# Patient Record
Sex: Male | Born: 1970 | Race: White | Hispanic: No | Marital: Married | Smoking: Former smoker
Health system: Southern US, Community
[De-identification: ages and names within clinical notes are randomized; demographics above are authoritative.]

## PROBLEM LIST (undated history)

## (undated) DIAGNOSIS — E785 Hyperlipidemia, unspecified: Secondary | ICD-10-CM

## (undated) HISTORY — DX: Hyperlipidemia, unspecified: E78.5

## (undated) HISTORY — PX: TONSILLECTOMY: SUR1361

---

## 2003-06-09 ENCOUNTER — Ambulatory Visit (HOSPITAL_COMMUNITY): Admission: RE | Admit: 2003-06-09 | Discharge: 2003-06-09 | Payer: Self-pay | Admitting: Otolaryngology

## 2015-12-31 ENCOUNTER — Other Ambulatory Visit: Payer: Self-pay | Admitting: Family Medicine

## 2016-01-01 LAB — CMP12+LP+TP+TSH+6AC+PSA+CBC…
A/G RATIO: 1.8 (ref 1.2–2.2)
ALT: 26 IU/L (ref 0–44)
AST: 19 IU/L (ref 0–40)
Albumin: 4.6 g/dL (ref 3.5–5.5)
Alkaline Phosphatase: 84 IU/L (ref 39–117)
BASOS: 0 %
BILIRUBIN TOTAL: 0.6 mg/dL (ref 0.0–1.2)
BUN / CREAT RATIO: 14 (ref 9–20)
BUN: 14 mg/dL (ref 6–24)
Basophils Absolute: 0 10*3/uL (ref 0.0–0.2)
CHLORIDE: 102 mmol/L (ref 96–106)
CHOL/HDL RATIO: 3.7 ratio (ref 0.0–5.0)
CREATININE: 0.98 mg/dL (ref 0.76–1.27)
Calcium: 9.8 mg/dL (ref 8.7–10.2)
Cholesterol, Total: 201 mg/dL — ABNORMAL HIGH (ref 100–199)
EOS (ABSOLUTE): 0.2 10*3/uL (ref 0.0–0.4)
EOS: 4 %
Estimated CHD Risk: 0.6 times avg. (ref 0.0–1.0)
Free Thyroxine Index: 1.7 (ref 1.2–4.9)
GFR, EST AFRICAN AMERICAN: 107 mL/min/{1.73_m2} (ref >59)
GFR, EST NON AFRICAN AMERICAN: 93 mL/min/{1.73_m2} (ref >59)
GGT: 12 IU/L (ref 0–65)
GLUCOSE: 99 mg/dL (ref 65–99)
Globulin, Total: 2.5 g/dL (ref 1.5–4.5)
HDL: 54 mg/dL (ref >39)
HEMATOCRIT: 46.6 % (ref 37.5–51.0)
HEMOGLOBIN: 15.4 g/dL (ref 12.6–17.7)
IMMATURE GRANS (ABS): 0 10*3/uL (ref 0.0–0.1)
Immature Granulocytes: 1 %
Iron: 75 ug/dL (ref 38–169)
LDH: 194 IU/L (ref 121–224)
LDL Calculated: 123 mg/dL — ABNORMAL HIGH (ref 0–99)
LYMPHS: 38 %
Lymphocytes Absolute: 1.6 10*3/uL (ref 0.7–3.1)
MCH: 29.7 pg (ref 26.6–33.0)
MCHC: 33 g/dL (ref 31.5–35.7)
MCV: 90 fL (ref 79–97)
MONOCYTES: 9 %
Monocytes Absolute: 0.4 10*3/uL (ref 0.1–0.9)
Neutrophils Absolute: 2 10*3/uL (ref 1.4–7.0)
Neutrophils: 48 %
PHOSPHORUS: 2.4 mg/dL — AB (ref 2.5–4.5)
POTASSIUM: 4.4 mmol/L (ref 3.5–5.2)
Platelets: 278 10*3/uL (ref 150–379)
Prostate Specific Ag, Serum: 0.5 ng/mL (ref 0.0–4.0)
RBC: 5.18 x10E6/uL (ref 4.14–5.80)
RDW: 13.7 % (ref 12.3–15.4)
Sodium: 144 mmol/L (ref 134–144)
T3 Uptake Ratio: 28 % (ref 24–39)
T4, Total: 5.9 ug/dL (ref 4.5–12.0)
TSH: 3.53 u[IU]/mL (ref 0.450–4.500)
Total Protein: 7.1 g/dL (ref 6.0–8.5)
Triglycerides: 121 mg/dL (ref 0–149)
URIC ACID: 7.3 mg/dL (ref 3.7–8.6)
VLDL CHOLESTEROL CAL: 24 mg/dL (ref 5–40)
WBC: 4.2 10*3/uL (ref 3.4–10.8)

## 2016-01-01 LAB — HGB A1C W/O EAG: Hgb A1c MFr Bld: 5.5 % (ref 4.8–5.6)

## 2016-08-10 ENCOUNTER — Ambulatory Visit: Payer: Self-pay | Admitting: Registered Nurse

## 2016-08-10 VITALS — BP 140/88 | HR 52 | Temp 98.0°F

## 2016-08-10 DIAGNOSIS — H65193 Other acute nonsuppurative otitis media, bilateral: Secondary | ICD-10-CM

## 2016-08-10 DIAGNOSIS — J029 Acute pharyngitis, unspecified: Secondary | ICD-10-CM

## 2016-08-10 DIAGNOSIS — J00 Acute nasopharyngitis [common cold]: Secondary | ICD-10-CM

## 2016-08-10 MED ORDER — IBUPROFEN 800 MG PO TABS: 800.0000 mg | ORAL_TABLET | Freq: Three times a day (TID) | ORAL | 0 refills | Status: DC

## 2016-08-10 MED ORDER — AMOXICILLIN 875 MG PO TABS: 875.0000 mg | ORAL_TABLET | Freq: Two times a day (BID) | ORAL | 0 refills | Status: DC

## 2016-08-10 MED ORDER — SALINE SPRAY 0.65 % NA SOLN: 2.0000 | NASAL | 0 refills | Status: DC

## 2016-08-10 MED ORDER — FLUTICASONE PROPIONATE 50 MCG/ACT NA SUSP
1.0000 | Freq: Two times a day (BID) | NASAL | 0 refills | Status: DC
Start: 2016-08-10 — End: 2016-12-29

## 2016-08-10 NOTE — Patient Instructions (Addendum)
Honey with lemon, hydrate, cool/ice in drinks Motrin  by mouth every 8 hours as needed for pain Start flonase 1 spray each nostril twice a day Nasal saline 2 sprays each nostril every 2 hours while awake Shower twice a day Amoxicillin  by mouth twice a day for 10 days Avoid sticking q-tips/fingers in ear canals  Allergic Rhinitis Allergic rhinitis is when the mucous membranes in the nose respond to allergens. Allergens are particles in the air that cause your body to have an allergic reaction. This causes you to release allergic antibodies. Through a chain of events, these eventually cause you to release histamine into the blood stream. Although meant to protect the body, it is this release of histamine that causes your discomfort, such as frequent sneezing, congestion, and an itchy, runny nose. What are the causes? Seasonal allergic rhinitis (hay fever) is caused by pollen allergens that may come from grasses, trees, and weeds. Year-round allergic rhinitis (perennial allergic rhinitis) is caused by allergens such as house dust mites, pet dander, and mold spores. What are the signs or symptoms?  Nasal stuffiness (congestion).  Itchy, runny nose with sneezing and tearing of the eyes. How is this diagnosed? Your health care provider can help you determine the allergen or allergens that trigger your symptoms. If you and your health care provider are unable to determine the allergen, skin or blood testing may be used. Your health care provider will diagnose your condition after taking your health history and performing a physical exam. Your health care provider may assess you for other related conditions, such as asthma, pink eye, or an ear infection. How is this treated? Allergic rhinitis does not have a cure, but it can be controlled by:  Medicines that block allergy symptoms. These may include allergy shots, nasal sprays, and oral antihistamines.  Avoiding the allergen. Hay fever  may often be treated with antihistamines in pill or nasal spray forms. Antihistamines block the effects of histamine. There are over-the-counter medicines that may help with nasal congestion and swelling around the eyes. Check with your health care provider before taking or giving this medicine. If avoiding the allergen or the medicine prescribed do not work, there are many new medicines your health care provider can prescribe. Stronger medicine may be used if initial measures are ineffective. Desensitizing injections can be used if medicine and avoidance does not work. Desensitization is when a patient is given ongoing shots until the body becomes less sensitive to the allergen. Make sure you follow up with your health care provider if problems continue. Follow these instructions at home: It is not possible to completely avoid allergens, but you can reduce your symptoms by taking steps to limit your exposure to them. It helps to know exactly what you are allergic to so that you can avoid your specific triggers. Contact a health care provider if:  You have a fever.  You develop a cough that does not stop easily (persistent).  You have shortness of breath.  You start wheezing.  Symptoms interfere with normal daily activities. This information is not intended to replace advice given to you by your health care provider. Make sure you discuss any questions you have with your health care provider. Document Released: 01/17/2001 Document Revised: 12/24/2015 Document Reviewed: 12/30/2012 Elsevier Interactive Patient Education  2017 [employer] Nonallergic Rhinitis  1. Nonallergic rhinitis is a term used by allergist to describe inflammation in the nose that is not due to an allergic source (i.e., pollens, mold, animal  dander or dust mites). 2. Nonallergic rhinitis can mimic many of the symptoms caused by allergies.  This includes a runny nose ("rhinorrhea"), sneezing, congestion, ear fullness and  post nasal drip. 3. These symptoms usually occur year round but can be made worse by many environmental factors including weather change, irritants such as cigarette smoke, fumes, perfumes, detergents and many others. These are not allergens, but are irritants, and do not cause the formation of antibodies like true allergens.  For this reason most people with nonallergic rhinitis have negative skin tests. 4. Unfortunately, we have a poor understanding of what causes nonallergic rhinitis but certain factors such as deviated septum, sinusitis and nasal polyps may contribute to the symptoms.  Due to the poor understanding of what causes nonallergic rhinitis it cannot be cured and our treatment is only symptomatic to control symptoms. 5. Important factors in the successful treatment of nonallergic rhinitis include avoidance of the irritants that cause symptoms; for example, people who continue to smoke may never improve.  Continuous therapy works better than intermittent.  This may mean taking medications once daily or 3-4 times a day. Helpful treatments include nasal saline lavage, nasal ipratropium (Atrovent), nasal steroids, and decongestants.  Pure antihistamines don't seem to work very well.  Immunotherapy (allergy shots) has no role in the treatment of nonallergic rhinitis.  Several medications may have to be tried before a good combination is found for you.  These medications, in general, are safe for long term use.  Tolerance may develop to the therapeutic effects of these medications: Frequently changing between several helpful medications can prevent this should it occur.Pharyngitis Pharyngitis is redness, pain, and swelling (inflammation) of your pharynx. What are the causes? Pharyngitis is usually caused by infection. Most of the time, these infections are from viruses (viral) and are part of a cold. However, sometimes pharyngitis is caused by bacteria (bacterial). Pharyngitis can also be caused by  allergies. Viral pharyngitis may be spread from person to person by coughing, sneezing, and personal items or utensils (cups, forks, spoons, toothbrushes). Bacterial pharyngitis may be spread from person to person by more intimate contact, such as kissing. What are the signs or symptoms? Symptoms of pharyngitis include:  Sore throat.  Tiredness (fatigue).  Low-grade fever.  Headache.  Joint pain and muscle aches.  Skin rashes.  Swollen lymph nodes.  Plaque-like film on throat or tonsils (often seen with bacterial pharyngitis). How is this diagnosed? Your health care provider will ask you questions about your illness and your symptoms. Your medical history, along with a physical exam, is often all that is needed to diagnose pharyngitis. Sometimes, a rapid strep test is done. Other lab tests may also be done, depending on the suspected cause. How is this treated? Viral pharyngitis will usually get better in 3-4 days without the use of medicine. Bacterial pharyngitis is treated with medicines that kill germs (antibiotics). Follow these instructions at home:  Drink enough water and fluids to keep your urine clear or pale yellow.  Only take over-the-counter or prescription medicines as directed by your health care provider:  If you are prescribed antibiotics, make sure you finish them even if you start to feel better.  Do not take aspirin.  Get lots of rest.  Gargle with 8 oz of salt water ( tsp of salt per 1 qt of water) as often as every 1-2 hours to soothe your throat.  Throat lozenges (if you are not at risk for choking) or sprays may be used to  soothe your throat. Contact a health care provider if:  You have large, tender lumps in your neck.  You have a rash.  You cough up green, yellow-brown, or bloody spit. Get help right away if:  Your neck becomes stiff.  You drool or are unable to swallow liquids.  You vomit or are unable to keep medicines or liquids  down.  You have severe pain that does not go away with the use of recommended medicines.  You have trouble breathing (not caused by a stuffy nose). This information is not intended to replace advice given to you by your health care provider. Make sure you discuss any questions you have with your health care provider. Document Released: 04/24/2005 Document Revised: 09/30/2015 Document Reviewed: 12/30/2012 Elsevier Interactive Patient Education  2017 [employer]  Otitis Media, Adult Otitis media is redness, soreness, and puffiness (swelling) in the space just behind your eardrum (middle ear). It may be caused by allergies or infection. It often happens along with a cold. Follow these instructions at home:  Take your medicine as told. Finish it even if you start to feel better.  Only take over-the-counter or prescription medicines for pain, discomfort, or fever as told by your doctor.  Follow up with your doctor as told. Contact a doctor if:  You have otitis media only in one ear, or bleeding from your nose, or both.  You notice a lump on your neck.  You are not getting better in 3-5 days.  You feel worse instead of better. Get help right away if:  You have pain that is not helped with medicine.  You have puffiness, redness, or pain around your ear.  You get a stiff neck.  You cannot move part of your face (paralysis).  You notice that the bone behind your ear hurts when you touch it. This information is not intended to replace advice given to you by your health care provider. Make sure you discuss any questions you have with your health care provider. Document Released: 10/11/2007 Document Revised: 09/30/2015 Document Reviewed: 11/19/2012 Elsevier Interactive Patient Education  2017 [employer].

## 2016-08-10 NOTE — Progress Notes (Signed)
Subjective:    Patient ID: John [name], male    DOB: April 11, 1971, 46 y.o.   MRN: [id number]  45y/o caucasian male Pt reports feeling of lump in throat with difficulty swallowing x2 days. Denies nasal congestion, cough, chest congestion. RN performed otoscope exam this am, and pt has been having soreness in both ears since then. Difficult to visualize due to pt's sensitivity, but no erythema or drainage noted. Scarring present on L TM at 1oclock position. Hx of ear infections when younger. Took Benadryl 2 hours PTA for possible allergies.  Uses q-tips in ears once a month.  Denied recent illness.  Sometimes gets spring allergies denied eye itching/watering.  Exposure to sick coworkers in past week.      Review of Systems  Constitutional: Negative for activity change, appetite change, chills, diaphoresis, fatigue, fever and unexpected weight change.  HENT: Positive for congestion, ear pain, rhinorrhea, sore throat and trouble swallowing. Negative for dental problem, drooling, ear discharge, facial swelling, hearing loss, mouth sores, nosebleeds, postnasal drip, sinus pain, sinus pressure, sneezing, tinnitus and voice change.   Eyes: Negative for photophobia, pain, discharge, redness, itching and visual disturbance.  Respiratory: Negative for cough, choking, chest tightness, shortness of breath, wheezing and stridor.   Cardiovascular: Negative for chest pain, palpitations and leg swelling.  Gastrointestinal: Negative for abdominal distention, abdominal pain, blood in stool, constipation, diarrhea, nausea and vomiting.  Endocrine: Negative for cold intolerance and heat intolerance.  Genitourinary: Negative for dysuria.  Musculoskeletal: Negative for arthralgias, back pain, gait problem, joint swelling, myalgias, neck pain and neck stiffness.  Skin: Negative for color change, pallor, rash and wound.  Allergic/Immunologic: Positive for environmental allergies. Negative for food allergies and  immunocompromised state.  Neurological: Negative for dizziness, tremors, seizures, syncope, facial asymmetry, speech difficulty, weakness, light-headedness, numbness and headaches.  Hematological: Negative for adenopathy. Does not bruise/bleed easily.  Psychiatric/Behavioral: Negative for agitation, behavioral problems, confusion and sleep disturbance.       Objective:   Physical Exam  Constitutional: He is oriented to person, place, and time. Vital signs are normal. He appears well-developed and well-nourished. He is active and cooperative.  Non-toxic appearance. He does not have a sickly appearance. He does not appear ill. No distress.  HENT:  Head: Normocephalic and atraumatic.  Right Ear: Hearing and ear canal normal. There is swelling and tenderness. Tympanic membrane is erythematous and bulging. A middle ear effusion is present.  Left Ear: Hearing and ear canal normal. There is swelling and tenderness. Tympanic membrane is erythematous and bulging. A middle ear effusion is present.  Nose: Mucosal edema and rhinorrhea present. No nose lacerations, sinus tenderness, nasal deformity, septal deviation or nasal septal hematoma. No epistaxis.  No foreign bodies. Right sinus exhibits maxillary sinus tenderness. Right sinus exhibits no frontal sinus tenderness. Left sinus exhibits maxillary sinus tenderness. Left sinus exhibits no frontal sinus tenderness.  Mouth/Throat: Uvula is midline and mucous membranes are normal. Mucous membranes are not pale, not dry and not cyanotic. He does not have dentures. No oral lesions. No trismus in the jaw. Normal dentition. No dental abscesses, uvula swelling, lacerations or dental caries. Posterior oropharyngeal edema and posterior oropharyngeal erythema present. No oropharyngeal exudate or tonsillar abscesses.  Bilateral external auditory canals with dry flaky skin and abrasion noted 6 oclock bilateral distal canals; bilateral TMs air fluid level slight opacity  erythema bulging air fluid level; auditory canal with edema 1-2+ distal near abrasion and debris noted yellow 6 oclock; cobblestoning posterior pharynx; bilateral allergic shiners;  bilateral nasal turbinates edema/erythema clear discharge  Eyes: Conjunctivae, EOM and lids are normal. Pupils are equal, round, and reactive to light. Right eye exhibits no chemosis, no discharge, no exudate and no hordeolum. No foreign body present in the right eye. Left eye exhibits no chemosis, no discharge, no exudate and no hordeolum. No foreign body present in the left eye. Right conjunctiva is not injected. Right conjunctiva has no hemorrhage. Left conjunctiva is not injected. Left conjunctiva has no hemorrhage. No scleral icterus. Right eye exhibits normal extraocular motion and no nystagmus. Left eye exhibits normal extraocular motion and no nystagmus. Right pupil is round and reactive. Left pupil is round and reactive. Pupils are equal.  Neck: Trachea normal and normal range of motion. Neck supple. No tracheal tenderness, no spinous process tenderness and no muscular tenderness present. No neck rigidity. No tracheal deviation, no edema, no erythema and normal range of motion present. No thyroid mass and no thyromegaly present.  Cardiovascular: Normal rate, regular rhythm, S1 normal, S2 normal, normal heart sounds and intact distal pulses.  PMI is not displaced.  Exam reveals no gallop and no friction rub.   No murmur heard. Pulmonary/Chest: Effort normal and breath sounds normal. No stridor. No respiratory distress. He has no decreased breath sounds. He has no wheezes. He has no rhonchi. He has no rales.  Speaks full sentences without difficulty; no cough observed  Abdominal: Soft. He exhibits no distension.  Musculoskeletal: Normal range of motion. He exhibits no edema or tenderness.       Right shoulder: Normal.       Left shoulder: Normal.       Right elbow: Normal.      Left elbow: Normal.       Right hip:  Normal.       Left hip: Normal.       Right knee: Normal.       Left knee: Normal.       Cervical back: Normal.       Thoracic back: Normal.       Lumbar back: Normal.       Right hand: Normal.       Left hand: Normal.  Lymphadenopathy:       Head (right side): No submental, no submandibular, no tonsillar, no preauricular, no posterior auricular and no occipital adenopathy present.       Head (left side): No submental, no submandibular, no tonsillar, no preauricular, no posterior auricular and no occipital adenopathy present.    He has no cervical adenopathy.       Right cervical: No superficial cervical, no deep cervical and no posterior cervical adenopathy present.      Left cervical: No superficial cervical, no deep cervical and no posterior cervical adenopathy present.  Neurological: He is alert and oriented to person, place, and time. He is not disoriented. He displays no atrophy and no tremor. No cranial nerve deficit or sensory deficit. He exhibits normal muscle tone. He displays no seizure activity. Coordination and gait normal. GCS eye subscore is 4. GCS verbal subscore is 5. GCS motor subscore is 6.  Skin: Skin is warm, dry and intact. No abrasion, no bruising, no burn, no ecchymosis, no laceration, no lesion, no petechiae and no rash noted. He is not diaphoretic. No cyanosis or erythema. No pallor. Nails show no clubbing.  Psychiatric: He has a normal mood and affect. His speech is normal and behavior is normal. Judgment and thought content normal. Cognition and memory are normal.  Nursing note and vitals reviewed.         Assessment & Plan:  A-bilateral otitis media acute nonreoccurent nonsupportive; acute pharyngitis; acute rhinitis  P-Amoxicillin  po BID x 10 days #20 RF0 dispensed from [facility].  Patient has motrin at home discussed  po TID prn pain.  Avoid use of q-tips in ears.Treatment as ordered.  Symptomatic therapy suggested fluids, NSAIDs and rest.  May take  Tylenol or Motrin for fevers.  Call or return to clinic as needed if these symptoms worsen or fail to improve as anticipated. Exitcare handout on otitis media given to patient.  Patient verbalized agreement and understanding of treatment plan.   P2:  Hand washing  Usually no specific medical treatment is needed if a virus is causing the sore throat.  The throat most often gets better on its own within 5 to 7 days.  Antibiotic medicine does not cure viral pharyngitis.   For acute pharyngitis caused by bacteria, your healthcare provider will prescribe an antibiotic.  Marland Kitchen Do not smoke.  Marland Kitchen Avoid secondhand smoke and other air pollutants.  . Use a cool mist humidifier to add moisture to the air.  . Get plenty of rest.  . You may want to rest your throat by talking less and eating a diet that is mostly liquid or soft for a day or two.   Marland Kitchen Nonprescription throat lozenges and mouthwashes should help relieve the soreness.   . Gargling with warm saltwater and drinking warm liquids may help.  (You can make a saltwater solution by adding 1/4 teaspoon of salt to 8 ounces, or 240 mL, of warm water.)  . A nonprescription pain reliever such as aspirin, acetaminophen, or ibuprofen may ease general aches and pains.   FOLLOW UP with clinic provider if no improvements in the next 7-10 days.  Patient verbalized understanding of instructions and agreed with plan of care. P2:  Hand washing and diet.  Discussed with patient this could be viral or allergic rhinitis as increased pollen and viral activity in community the past two weeks.  Flonase works against viral, allergic and bacterial rhinitis.  Patient denied self/ family history ENT cancer.  Patient may use normal saline nasal spray as needed.  Consider antihistamine (zyrtec/claritin  po daily or benadryl  po TID prn rhinitis).  Start flonase 1 spray each nostril BID #1 RF0 dispensed from [facility] and nasal saline 1 UD bottle use 2 sprays each nostril q2h wa dispensed  from clinic stock.  Avoid triggers if possible.  Shower prior to bedtime if exposed to triggers.  If allergic dust/dust mites recommend mattress/pillow covers/encasements; washing linens, vacuuming, sweeping, dusting weekly.  Call or return to clinic as needed if these symptoms worsen or fail to improve as anticipated.   Exitcare handout on allergic rhinitis given to patient.  Patient verbalized understanding of instructions, agreed with plan of care and had no further questions at this time.  P2:  Avoidance and hand washing.

## 2016-10-03 ENCOUNTER — Ambulatory Visit: Payer: Self-pay | Admitting: Registered Nurse

## 2016-10-03 VITALS — BP 140/88 | HR 44 | Temp 97.4°F

## 2016-10-03 DIAGNOSIS — W57XXXA Bitten or stung by nonvenomous insect and other nonvenomous arthropods, initial encounter: Secondary | ICD-10-CM

## 2016-10-03 MED ORDER — DOXYCYCLINE HYCLATE 100 MG PO TABS: 100.0000 mg | ORAL_TABLET | Freq: Two times a day (BID) | ORAL | 0 refills | Status: AC

## 2016-10-03 NOTE — Patient Instructions (Signed)
Doxycycline 100mg  by mouth twice a day for 10 days May apply hydrocortisone 1% twice a day for 7 days to affected areas Zyrtec 10mg  by mouth once a day prn itching Use DEET prior to hiking in woods on skin; consider treating clothes with permethin Follow up re-evaluation if purulent discharge; worsening rash, fever, joint aches, headache, fatigue, muscle aches  Tick Bite Information, Adult Ticks are insects that draw blood for food. Most ticks live in shrubs and grassy areas. They climb onto people and animals that brush against the leaves and grasses that they rest on. Then they bite, attaching themselves to the skin. Most ticks are harmless, but some ticks carry germs that can spread to a person through a bite and cause a disease. To reduce your risk of getting a disease from a tick bite, it is important to take steps to prevent tick bites. It is also important to check for ticks after being outdoors. If you find that a tick has attached to you, watch for symptoms of disease. How can I prevent tick bites? Take these steps to help prevent tick bites when you are outdoors in an area where ticks are found:  Use insect repellent that has DEET (20% or higher), picaridin, or IR3535 in it. Use it on:  Skin that is showing.  The top of your boots.  Your pant legs.  Your sleeve cuffs.  For repellent products that contain permethrin, follow product instructions. Use these products on:  Clothing.  Gear.  Boots.  Tents.  Wear protective clothing. Long sleeves and long pants offer the best protection from ticks.  Wear light-colored clothing so you can see ticks more easily.  Tuck your pant legs into your socks.  If you go walking on a trail, stay in the middle of the trail so your skin, hair, and clothing do not touch the bushes.  Avoid walking through areas with long grass.  Check for ticks on your clothing, hair, and skin often while you are outside, and check again before you go  inside. Make sure to check the places that ticks attach themselves most often. These places include the scalp, neck, armpits, waist, groin, and joint areas. Ticks that carry a disease called Lyme disease have to be attached to the skin for 24-48 hours. Checking for ticks every day will lessen your risk of this and other diseases.  When you come indoors, wash your clothes and take a shower or a bath right away. Dry your clothes in a dryer on high heat for at least 60 minutes. This will kill any ticks in your clothes. What is the proper way to remove a tick? If you find a tick on your body, remove it as soon as possible. Removing a tick sooner rather than later can prevent germs from passing from the tick to your body. To remove a tick that is crawling on your skin but has not bitten:  Go outdoors and brush the tick off.  Remove the tick with tape or a lint roller. To remove a tick that is attached to your skin:  Wash your hands.  If you have latex gloves, put them on.  Use tweezers, curved forceps, or a tick-removal tool to gently grasp the tick as close to your skin and the tick's head as possible.  Gently pull with steady, upward pressure until the tick lets go. When removing the tick:  Take care to keep the tick's head attached to its body.  Do not twist  or jerk the tick. This can make the tick's head or mouth break off.  Do not squeeze or crush the tick's body. This could force disease-carrying fluids from the tick into your body. Do not try to remove a tick with heat, alcohol, petroleum jelly, or fingernail polish. Using these methods can cause the tick to salivate and regurgitate into your bloodstream, increasing your risk of getting a disease. What should I do after removing a tick?  Clean the bite area with soap and water, rubbing alcohol, or an iodine scrub.  If an antiseptic cream or ointment is available, apply a small amount to the bite site.  Wash and disinfect any  instruments that you used to remove the tick. How should I dispose of a tick? To dispose of a live tick, use one of these methods:  Place it in rubbing alcohol.  Place it in a sealed bag or container.  Wrap it tightly in tape.  Flush it down the toilet. Contact a health care provider if:  You have symptoms of a disease after a tick bite. Symptoms of a tick-borne disease can occur from moments after the tick bites to up to 30 days after a tick is removed. Symptoms include:  Muscle, joint, or bone pain.  Difficulty walking or moving your legs.  Numbness in the legs.  Paralysis.  Red rash around the tick bite area that is shaped like a target or a "bull's-eye."  Redness and swelling in the area of the tick bite.  Fever.  Repeated vomiting.  Diarrhea.  Weight loss.  Tender, swollen lymph glands.  Shortness of breath.  Cough.  Pain in the abdomen.  Headache.  Abnormal tiredness.  A change in your level of consciousness.  Confusion. Get help right away if:  You are not able to remove a tick.  A part of a tick breaks off and gets stuck in your skin.  Your symptoms get worse. Summary  Ticks may carry germs that can spread to a person through a bite and cause disease.  Wear protective clothing and use insect repellent to prevent tick bites. Follow product instructions.  If you find a tick on your body, remove it as soon as possible. If the tick is attached, do not try to remove with heat, alcohol, petroleum jelly, or fingernail polish.  Remove the attached tick using tweezers, curved forceps, or a tick-removal tool. Gently pull with steady, upward pressure until the tick lets go. Do not twist or jerk the tick. Do not squeeze or crush the tick's body.  If you have symptoms after being bitten by a tick, contact a health care provider. This information is not intended to replace advice given to you by your health care provider. Make sure you discuss any  questions you have with your health care provider. Document Released: 04/21/2000 Document Revised: 02/04/2016 Document Reviewed: 02/04/2016 Elsevier Interactive Patient Education  2017 [employer]  Preventing Mosquito-Borne Illnesses Mosquito-borne illnesses are diseases that are carried by mosquitoes. You may become sick if you are bitten by a mosquito that is carrying a virus or parasite. These germs can be different depending on where you are in the world. Examples of mosquito-borne illnesses include Zika virus disease, West Nile virus infection, and Dengue fever. You may be at risk for mosquito-borne illnesses both at home and when you travel. If you are planning to travel internationally:  Talk with a health care provider who is familiar with travel medicine 4-6 weeks before your trip.  Ask about the risks of mosquito-borne illness and how to stay safe.  Take malaria prevention medicine as told by your health care provider, if you will be traveling to an area where malaria is found. You may need to start taking this medicine before you go on your trip. How can I protect myself from mosquito bites? Take the following actions to protect yourself and your family from mosquito bites at home and when you travel:  Wear loose clothing that covers your arms and legs.  Limit your outdoor activities. Avoid being outdoors in the early evening. Mosquitoes are most active during that time.  Do not open windows unless they have window screens.  Sleep under mosquito nets.  Use insect repellent that contains at least 20% DEET. Other ingredients that help to protect against mosquitoes include picaridin, oil of lemon eucalyptus (OLE), and IR3535.  Most insect repellents are safe to use during pregnancy. Talk with your health care provider if you have questions or concerns.  Do not use OLE on children who are younger than 64 years of age. Do not use insect repellent on babies who are younger than 63  months of age.  Only apply insect repellent to surfaces that are directly exposed to air. Apply to any exposed skin or to the surface of your clothing. Do not apply insect repellent underneath clothing.  If you are using sunscreen, apply it before you apply insect repellent.  Treat clothing with permethrin. Do not apply permethrin directly to your skin. Follow label directions for safe use.  Get rid of any standing water near you because that is where mosquitoes often reproduce. Standing water is often found in items such as buckets, bowls, animal food dishes, and flowerpots. How should I use DEET insect repellent?  Use DEET according to the directions on the label.  When applying DEET to children, use the lowest effective concentration. Repellent with 10% DEET will last approximately 2-3 hours, while 30% DEET will last 4-5 hours. Do not use DEET on babies who are younger than 72 months of age. Do not apply DEET more often than one time a day to children who are younger than 59 years of age.  Avoid prolonged or excessive use of DEET. Use it sparingly to cover exposed skin and clothing. Skin irritation can occur in some people, but this is not common.  Wash all treated skin and clothing with soap and water after you go back indoors.  Do not allow children to apply insect repellent by themselves.  Do not apply DEET near cuts or open wounds.  Do not apply DEET to a child's hands or near a child's eyes and mouth.  If DEET is accidentally sprayed in the eyes, wash the eyes out with large amounts of water.  If your child swallows a DEET-containing product, rinse the mouth, have the child drink water, and call your health care provider.  Store DEET where children cannot reach it.  If you are pregnant, use DEET only when you are in areas with a high risk of mosquito-borne illness. What should I watch for if I have a mosquito bite? Most mosquito bites will not turn into a serious illness.  However, if you recently traveled to an area where mosquito-borne illnesses are common, make sure to watch for the following symptoms:  Sudden high fever.  Chills.  Severe headache.  Pain behind the eyes.  Bone pain.  Nausea and vomiting.  Muscle and joint pain.  Lack of appetite.  Feeling extremely tired or generally unwell. Call your health care provider if you experience these symptoms up to two weeks after being bit by a mosquito. Where can I get more information? Before you travel, visit the Travelers' Health site of the Centers for Disease Control and Prevention (CDC) to learn more about mosquito-borne illnesses:  http://www.church.org/ Summary  You may be at risk for mosquito-borne illnesses both at home and when you travel.  Talk with a health care provider who is trained in travel medicine 4-6 weeks before you travel internationally. Ask about the risks of mosquito-borne illness and how to stay safe.  Use a DEET-containing insect repellent to prevent mosquito bites. This information is not intended to replace advice given to you by your health care provider. Make sure you discuss any questions you have with your health care provider. Document Released: 01/17/2001 Document Revised: 09/30/2015 Document Reviewed: 11/26/2014 Elsevier Interactive Patient Education  2017 Elsevier Inc.  Lyme Disease Lyme disease is an infection that affects many parts of the body, including the skin, joints, and nervous system. It is a bacterial infection that starts from the bite of an infected tick. The infection can spread, and some of the symptoms are similar to the flu. If Lyme disease is not treated, it may cause joint pain, swelling, numbness, problems thinking, fatigue, muscle weakness, and other problems. What are the causes? This condition is caused by bacteria called Borrelia burgdorferi. You can get Lyme disease by being bitten by an infected tick. The tick must be attached to your  skin to pass along the infection. Deer often carry infected ticks. What increases the risk? The following factors may make you more likely to develop this condition:  Living in or visiting these areas in the [place]  [place].  The [place].  The [place].  Spending time in wooded or grassy areas.  Being outdoors with exposed skin.  Camping, gardening, hiking, fishing, or hunting outdoors.  Failing to remove a tick from your skin within 3-4 days. What are the signs or symptoms? Symptoms of this condition include:  A round, red rash that surrounds the center of the tick bite. This is the first sign of infection. The center of the rash may be blood colored or have tiny blisters.  Fatigue.  Headache.  Chills and fever.  General achiness.  Joint pain, often in the knees.  Muscle pain.  Swollen lymph glands.  Stiff neck. How is this diagnosed? This condition is diagnosed based on:  Your symptoms and medical history.  A physical exam.  A blood test. How is this treated? The main treatment for this condition is antibiotic medicine, which is usually taken by mouth (orally). The length of treatment depends on how soon after a tick bite you begin taking the medicine. In some cases, treatment is necessary for several weeks. If the infection is severe, antibiotics may need to be given through an IV tube that is inserted into one of your veins. Follow these instructions at home:  Take your antibiotic medicine as told by your health care provider. Do not stop taking the antibiotic even if you start to feel better.  Ask your health care provider about takinga probiotic in between doses of your antibiotic to help avoid stomach upset or diarrhea.  Check with your health care provider before supplementing your treatment. Many alternative therapies have not been proven and may be harmful to you.  Keep all follow-up visits as told by your health care provider.  This is important. How is this prevented? You can become reinfected if you get another tick bite from an infected tick. Take these steps to help prevent an infection:  Cover your skin with light-colored clothing when you are outdoors in the spring and summer months.  Spray clothing and skin with bug spray. The spray should be 20-30% DEET.  Avoid wooded, grassy, and shaded areas.  Remove yard litter, brush, trash, and plants that attract deer and rodents.  Check yourself for ticks when you come indoors.  Wash clothing worn each day.  Check your pets for ticks before they come inside.  If you find a tick:  Remove it with tweezers.  Clean your hands and the bite area with rubbing alcohol or soap and water. Pregnant women should take special care to avoid tick bites because the infection can be passed along to the fetus. Contact a health care provider if:  You have symptoms after treatment.  You have removed a tick and want to bring it to your health care provider for testing. Get help right away if:  You have an irregular heartbeat.  You have nerve pain.  Your face feels numb. This information is not intended to replace advice given to you by your health care provider. Make sure you discuss any questions you have with your health care provider. Document Released: 07/31/2000 Document Revised: 12/14/2015 Document Reviewed: 12/14/2015 Elsevier Interactive Patient Education  2017 [employer].

## 2016-10-03 NOTE — Progress Notes (Signed)
Subjective:    Patient ID: John [name], male    DOB: 1970-11-01, 46 y.o.   MRN: [id number]  45y/o married male caucasian established patient reports 3 tick bites on Saturday, 3 days ago, after working in the woods cutting down 6 trees. Removed all 3 including the heads within 2 hours after working. Then 2 additional bites yesterday while working outside. Also removed these. Reports most are at his waistline. One on L neck with yellow pustule and erythema surrounding. Reports sites are itchy, nonpainful. Denies fever, chills, rashes. Believes ticks were brown, did not notice a spot on the backs. Unsure if any were engorged. Did not use DEET or spray clothes with permethrin.  Dog sleeps in house with them.  Denied headaches, fatigue, joint aches.  Having some localized pain where rash noted.  Buttock rash might be a little better today but patient unsure as area difficult for him to [name].      Review of Systems  Constitutional: Negative for chills and fever.  HENT: Negative for congestion, ear pain and sore throat.   Eyes: Negative for pain and discharge.  Respiratory: Negative for cough and wheezing.   Cardiovascular: Negative for chest pain and leg swelling.  Gastrointestinal: Negative for blood in stool, constipation, diarrhea, nausea and vomiting.  Genitourinary: Negative for difficulty urinating, dysuria and hematuria.  Musculoskeletal: Positive for myalgias. Negative for arthralgias, back pain, gait problem, joint swelling, neck pain and neck stiffness.  Skin: Positive for color change and rash. Negative for pallor and wound.  Allergic/Immunologic: Negative for environmental allergies and food allergies.  Neurological: Negative for headaches.  Hematological: Negative for adenopathy. Does not bruise/bleed easily.  Psychiatric/Behavioral: Negative for agitation, confusion and sleep disturbance. The patient is not nervous/anxious.        Objective:   Physical Exam  Constitutional:  He is oriented to person, place, and time. Vital signs are normal. He appears well-developed and well-nourished. He is active and cooperative.  Non-toxic appearance. He does not have a sickly appearance. He does not appear ill. No distress.  HENT:  Head: Normocephalic and atraumatic.  Right Ear: Hearing and external ear normal.  Left Ear: Hearing and external ear normal.  Nose: Nose normal. No mucosal edema, rhinorrhea, nose lacerations, nasal deformity, septal deviation or nasal septal hematoma. No epistaxis.  No foreign bodies.  Mouth/Throat: Uvula is midline and mucous membranes are normal. Mucous membranes are not pale, not dry and not cyanotic. He does not have dentures. No oral lesions. No trismus in the jaw. Normal dentition. No dental abscesses, uvula swelling, lacerations or dental caries. Posterior oropharyngeal edema and posterior oropharyngeal erythema present. No oropharyngeal exudate or tonsillar abscesses.  Cobblestoning posterior pharynx; bilateral allergic shiners  Eyes: Conjunctivae, EOM and lids are normal. Pupils are equal, round, and reactive to light. Right eye exhibits no chemosis, no discharge, no exudate and no hordeolum. No foreign body present in the right eye. Left eye exhibits no chemosis, no discharge, no exudate and no hordeolum. No foreign body present in the left eye. Right conjunctiva is not injected. Right conjunctiva has no hemorrhage. Left conjunctiva is not injected. Left conjunctiva has no hemorrhage. No scleral icterus. Right eye exhibits normal extraocular motion and no nystagmus. Left eye exhibits normal extraocular motion and no nystagmus. Right pupil is round and reactive. Left pupil is round and reactive. Pupils are equal.  Neck: Trachea normal, normal range of motion and phonation normal. Neck supple. No tracheal tenderness, no spinous process tenderness and no muscular  tenderness present. No neck rigidity. No tracheal deviation, no edema, no erythema and normal  range of motion present. No thyroid mass and no thyromegaly present.  Cardiovascular: Normal rate, regular rhythm, S1 normal, S2 normal, normal heart sounds and intact distal pulses.  PMI is not displaced.  Exam reveals no gallop and no friction rub.   No murmur heard. Pulses:      Radial pulses are 2+ on the right side, and 2+ on the left side.  Pulmonary/Chest: Effort normal and breath sounds normal. No accessory muscle usage or stridor. No respiratory distress. He has no decreased breath sounds. He has no wheezes. He has no rhonchi. He has no rales.  Abdominal: Soft. He exhibits no distension.  Musculoskeletal: Normal range of motion. He exhibits no edema, tenderness or deformity.       Right shoulder: Normal.       Left shoulder: Normal.       Right elbow: Normal.      Left elbow: Normal.       Right hip: Normal.       Left hip: Normal.       Right knee: Normal.       Left knee: Normal.       Cervical back: Normal.       Thoracic back: Normal.       Lumbar back: Normal.       Right hand: Normal.       Left hand: Normal.       Legs: Lymphadenopathy:       Head (right side): No submental, no submandibular, no tonsillar, no preauricular, no posterior auricular and no occipital adenopathy present.       Head (left side): No submental, no submandibular, no tonsillar, no preauricular, no posterior auricular and no occipital adenopathy present.    He has no cervical adenopathy.       Right cervical: No superficial cervical, no deep cervical and no posterior cervical adenopathy present.      Left cervical: No superficial cervical, no deep cervical and no posterior cervical adenopathy present.  Neurological: He is alert and oriented to person, place, and time. He has normal strength. He is not disoriented. He displays no atrophy and no tremor. No cranial nerve deficit or sensory deficit. He exhibits normal muscle tone. He displays no seizure activity. Coordination and gait normal. GCS eye  subscore is 4. GCS verbal subscore is 5. GCS motor subscore is 6.  In/out of chair without difficulty; gait sure and steady in hall/clinic  Skin: Skin is warm, dry and intact. Rash noted. No abrasion, no bruising, no burn, no ecchymosis, no laceration, no lesion, no petechiae and no purpura noted. Rash is macular, papular and maculopapular. Rash is not nodular, not pustular, not vesicular and not urticarial. He is not diaphoretic. There is erythema. No cyanosis. No pallor. Nails show no clubbing.     Papule erythematous base with scaling left inguinal, left neck; macular rash x2 grouped nummular left buttock  Psychiatric: He has a normal mood and affect. His speech is normal and behavior is normal. Judgment and thought content normal. Cognition and memory are normal.  Nursing note and vitals reviewed.         Assessment & Plan:  A-tick bites initial encounter P-left buttock bullseye rash therefore will start with 10 days doxycycline 100mg  po BID x 10 days [facility]. Avoid scratching may use zyrtec 10mg  po daily and/or ice cubes/hydrocortisone 1% topical BID prn  itching given 2 UD from clinic stock.    Patient to follow up with PCM or [place] if worsening symptoms or new symptoms for re-evaluation e.g. Fever, lethargy, hematemesis/hematuria/hemoptysis, wheezing, rash new areas or worsening.  Discussed DEET and permethrin use prior to activities in tall grass/woods.  Patient given exitcare handout on lyme disease, tick bites, how to prevent bites.  Patient verbalized understanding of information/instructions, agreed with plan of care and had no further questions at this time.

## 2016-12-29 ENCOUNTER — Ambulatory Visit: Payer: Self-pay | Admitting: *Deleted

## 2016-12-29 VITALS — BP 138/86 | HR 56 | Ht 71.5 in | Wt 195.0 lb

## 2016-12-29 DIAGNOSIS — Z0000 Encounter for general adult medical examination without abnormal findings: Secondary | ICD-10-CM

## 2016-12-29 NOTE — Progress Notes (Signed)
[employer] insurance premium discount evaluation: Labs Drawn. Replacements ROI form signed. Tobacco Free Attestation form signed.  Forms placed in paper chart.  No pcp to route results to.  Pt c/o R medial lateral knee pain. Stable when sitting or walking. Pain occurs when he pivots side to side while weight-bearing. Exacerbated while playing soccer. Medium knee sleeve provided for compression and support. Start Ibuprofen 800mg  TID po to help with inflammation.Pt agreeable to plan.

## 2016-12-30 LAB — CMP12+LP+TP+TSH+6AC+PSA+CBC…
ALBUMIN: 4.6 g/dL (ref 3.5–5.5)
ALK PHOS: 91 IU/L (ref 39–117)
ALT: 43 IU/L (ref 0–44)
AST: 29 IU/L (ref 0–40)
Albumin/Globulin Ratio: 2 (ref 1.2–2.2)
BASOS ABS: 0 10*3/uL (ref 0.0–0.2)
BUN / CREAT RATIO: 14 (ref 9–20)
BUN: 12 mg/dL (ref 6–24)
Basos: 1 %
Bilirubin Total: 0.5 mg/dL (ref 0.0–1.2)
CALCIUM: 9.1 mg/dL (ref 8.7–10.2)
CHOLESTEROL TOTAL: 154 mg/dL (ref 100–199)
Chloride: 104 mmol/L (ref 96–106)
Chol/HDL Ratio: 3.3 ratio (ref 0.0–5.0)
Creatinine, Ser: 0.86 mg/dL (ref 0.76–1.27)
EOS (ABSOLUTE): 0.1 10*3/uL (ref 0.0–0.4)
EOS: 2 %
FREE THYROXINE INDEX: 2.2 (ref 1.2–4.9)
GFR calc non Af Amer: 104 mL/min/{1.73_m2} (ref >59)
GFR, EST AFRICAN AMERICAN: 120 mL/min/{1.73_m2} (ref >59)
GGT: 17 IU/L (ref 0–65)
GLOBULIN, TOTAL: 2.3 g/dL (ref 1.5–4.5)
Glucose: 87 mg/dL (ref 65–99)
HDL: 47 mg/dL (ref >39)
HEMOGLOBIN: 15.2 g/dL (ref 13.0–17.7)
Hematocrit: 45.3 % (ref 37.5–51.0)
IMMATURE GRANS (ABS): 0 10*3/uL (ref 0.0–0.1)
IMMATURE GRANULOCYTES: 0 %
Iron: 98 ug/dL (ref 38–169)
LDH: 174 IU/L (ref 121–224)
LDL CALC: 97 mg/dL (ref 0–99)
LYMPHS: 44 %
Lymphocytes Absolute: 1.5 10*3/uL (ref 0.7–3.1)
MCH: 28.7 pg (ref 26.6–33.0)
MCHC: 33.6 g/dL (ref 31.5–35.7)
MCV: 86 fL (ref 79–97)
MONOCYTES: 9 %
Monocytes Absolute: 0.3 10*3/uL (ref 0.1–0.9)
NEUTROS ABS: 1.5 10*3/uL (ref 1.4–7.0)
Neutrophils: 44 %
PHOSPHORUS: 2.5 mg/dL (ref 2.5–4.5)
PLATELETS: 264 10*3/uL (ref 150–379)
Potassium: 4.1 mmol/L (ref 3.5–5.2)
Prostate Specific Ag, Serum: 0.5 ng/mL (ref 0.0–4.0)
RBC: 5.3 x10E6/uL (ref 4.14–5.80)
RDW: 13.8 % (ref 12.3–15.4)
Sodium: 142 mmol/L (ref 134–144)
T3 Uptake Ratio: 30 % (ref 24–39)
T4, Total: 7.3 ug/dL (ref 4.5–12.0)
TOTAL PROTEIN: 6.9 g/dL (ref 6.0–8.5)
TSH: 2.19 u[IU]/mL (ref 0.450–4.500)
Triglycerides: 48 mg/dL (ref 0–149)
URIC ACID: 5.6 mg/dL (ref 3.7–8.6)
VLDL CHOLESTEROL CAL: 10 mg/dL (ref 5–40)
WBC: 3.4 10*3/uL (ref 3.4–10.8)

## 2016-12-30 LAB — HGB A1C W/O EAG: Hgb A1c MFr Bld: 5.5 % (ref 4.8–5.6)

## 2017-01-01 NOTE — Progress Notes (Signed)
Results reviewed with pt. He is pleased that lipids have improved over past year as he thought with playing less sports due to knee injury that his wt and chol would have worsened. Wt stable from last year. All other labs WNL. Diet and exercise recommendations for general health discussed. Copy provided to pt. No pcp to route results to. No further/questions concerns.

## 2018-01-25 ENCOUNTER — Ambulatory Visit: Payer: No Typology Code available for payment source | Admitting: Family Medicine

## 2018-01-25 ENCOUNTER — Ambulatory Visit: Payer: Self-pay | Admitting: *Deleted

## 2018-01-25 ENCOUNTER — Encounter: Payer: Self-pay | Admitting: Family Medicine

## 2018-01-25 VITALS — BP 140/90 | HR 72 | Ht 71.0 in | Wt 206.4 lb

## 2018-01-25 VITALS — BP 144/102 | HR 48

## 2018-01-25 DIAGNOSIS — R030 Elevated blood-pressure reading, without diagnosis of hypertension: Secondary | ICD-10-CM

## 2018-01-25 DIAGNOSIS — R202 Paresthesia of skin: Secondary | ICD-10-CM

## 2018-01-25 DIAGNOSIS — I10 Essential (primary) hypertension: Secondary | ICD-10-CM

## 2018-01-25 DIAGNOSIS — Z0000 Encounter for general adult medical examination without abnormal findings: Secondary | ICD-10-CM

## 2018-01-25 NOTE — Progress Notes (Signed)
Pt has initial visit with new pcp today to establish care. He reports not feeling like himself yesterday afternoon, but unable to elaborate on this more. Does endorse tingling in his fingers of bilateral hands during that time. Checked his BP and found it to be 160/108. Has been trending upward over past couple of years and has been advised at [facility] evals to establish pcp care to manage BP. After this episode, he called primary office close to his home and set up new pt appt for today.  He reports still not feeling like himself and that tingling/numbness in his hands is still present. Sts he is fasting this morning. Labs drawn on standing orders for [facility] f/u and in anticipation of pcp OV today as he will not be fasting by that time. Informed pt that results will be available on Monday to him and will CC new pcp on results.

## 2018-01-25 NOTE — Progress Notes (Signed)
Subjective:  Patient ID: John [name], male    DOB: 06/03/1970  Age: 47 y.o. MRN: [id number]  CC: Establish Care   HPI John [name] presents for evaluation of his blood pressure.  Blood pressures have been mildly elevated in the recent past.  He is measured a pressure of 160/100 with his home cuff.  Chart review shows blood pressures roughly in the 140/90 range.  He does admit to a relatively high sodium diet.  He quit smoking some years ago.  Does not use illicit drugs.  He has no more than 1 or 2 alcoholic drinks in a given setting.  He denies any increased stress in his life.  He is married.  He has been at the same job for 20 years.  He is not sure about his family history with regards to blood pressure.  His mother did pass at age 47 from a stroke.  He has been having some lightheadedness with mild headaches and tingling in his fingers and toes.  He does have a history of anxiety in the past but does not feel as though he is been bothered bothered by that more recently.  No outpatient medications prior to visit.   No facility-administered medications prior to visit.     ROS Review of Systems  Constitutional: Negative for chills, diaphoresis, fatigue, fever and unexpected weight change.  HENT: Negative.   Eyes: Negative for photophobia and visual disturbance.  Respiratory: Negative.   Cardiovascular: Negative.   Gastrointestinal: Negative.   Musculoskeletal: Negative.   Skin: Negative for rash and wound.  Neurological: Positive for light-headedness, numbness and headaches. Negative for tremors, speech difficulty and weakness.  Hematological: Does not bruise/bleed easily.  Psychiatric/Behavioral: Negative.     Objective:  BP 140/90   Pulse 72   Ht 5\' 11"  (1.803 m)   Wt 206 lb 6 oz (93.6 kg)   SpO2 98%   BMI 28.78 kg/m   BP Readings from Last 3 Encounters:  01/25/18 140/90  01/25/18 (!) 144/102  12/29/16 138/86    Wt Readings from Last 3 Encounters:  01/25/18  206 lb 6 oz (93.6 kg)  12/29/16 195 lb (88.5 kg)    Physical Exam  Constitutional: He is oriented to person, place, and time. He appears well-developed and well-nourished. No distress.  HENT:  Head: Normocephalic and atraumatic.  Right Ear: External ear normal.  Left Ear: External ear normal.  Mouth/Throat: Oropharynx is clear and moist. No oropharyngeal exudate.  Eyes: Pupils are equal, round, and reactive to light. Conjunctivae and EOM are normal. Right eye exhibits no discharge. Left eye exhibits no discharge. No scleral icterus.  Neck: Normal range of motion. Neck supple. No JVD present. No tracheal deviation present. No thyromegaly present.  Cardiovascular: Normal rate, regular rhythm and normal heart sounds.  Pulmonary/Chest: Effort normal and breath sounds normal.  Abdominal: Bowel sounds are normal.  Lymphadenopathy:    He has no cervical adenopathy.  Neurological: He is alert and oriented to person, place, and time.  Skin: Skin is warm and dry. He is not diaphoretic.  Psychiatric: He has a normal mood and affect. His behavior is normal.   Dep[facility] screen [facility] 01/25/2018  Decreased Interest 0  Down, Depressed, Hopeless 0  PHQ - 2 Score 0  Altered sleeping 0  Tired, decreased energy 1  Change in appetite 1  Feeling bad or failure about yourself  0  Trouble concentrating 1  Moving slowly or fidgety/restless 0  Suicidal thoughts 0  PHQ-9  Score 3    Lab Results  Component Value Date   WBC 3.4 12/29/2016   HGB 15.2 12/29/2016   HCT 45.3 12/29/2016   PLT 264 12/29/2016   GLUCOSE 87 12/29/2016   CHOL 154 12/29/2016   TRIG 48 12/29/2016   HDL 47 12/29/2016   LDLCALC 97 12/29/2016   ALT 43 12/29/2016   AST 29 12/29/2016   NA 142 12/29/2016   K 4.1 12/29/2016   CL 104 12/29/2016   CREATININE 0.86 12/29/2016   BUN 12 12/29/2016   TSH 2.190 12/29/2016   HGBA1C 5.5 12/29/2016    Dg Esophagus  Result Date: 06/10/2003 Clinical Data:  47 year old with hiatal  hernia, reflux and heartburn. BARIUM ESOPHAGRAM Initial barium swallows demonstrate normal pharyngeal motion with swallowing. No evidence of laryngeal penetration or aspiration. No upper esophageal webs, strictures or diverticula.  The pyriform sinus and vallecular air spaces are normal. Esophageal motility was normal.  The patient does have a small hiatal hernia but gastroesophageal reflux was demonstrated.  No intrinsic or extrinsic mass lesions and no mucosal abnormalities.  IMPRESSION Small sliding type hiatal hernia but no gastroesophageal reflux is demonstrated. Normal esophageal motility. No webs or strictures. Provider: Queen Slough   Assessment & Plan:   John [name] was seen today for establish care.  Diagnoses and all orders for this visit:  Elevated BP without diagnosis of hypertension   John [name] does not currently have medications on file.  No orders of the defined types were placed in this encounter.  Anticipatory guidance was given to the patient regarding blood pressure and the DASH diet and how to check his blood pressure.  He will check his blood pressure with his home cuff for over the next 3 weeks and record his readings.  He will bring his cuff with him in 3 weeks and we will compare it to our wall unit.  At that time I think we will better be able to assess his need for possible treatment.  Follow-up: Return in about 3 weeks (around 02/15/2018).  Mliss Sax, MD

## 2018-01-25 NOTE — Patient Instructions (Signed)
Hypertension Hypertension, commonly called high blood pressure, is when the force of blood pumping through the arteries is too strong. The arteries are the blood vessels that carry blood from the heart throughout the body. Hypertension forces the heart to work harder to pump blood and may cause arteries to become narrow or stiff. Having untreated or uncontrolled hypertension can cause heart attacks, strokes, kidney disease, and other problems. A blood pressure reading consists of a higher number over a lower number. Ideally, your blood pressure should be below 120/80. The first ("top") number is called the systolic pressure. It is a measure of the pressure in your arteries as your heart beats. The second ("bottom") number is called the diastolic pressure. It is a measure of the pressure in your arteries as the heart relaxes. What are the causes? The cause of this condition is not known. What increases the risk? Some risk factors for high blood pressure are under your control. Others are not. Factors you can change  Smoking.  Having type 2 diabetes mellitus, high cholesterol, or both.  Not getting enough exercise or physical activity.  Being overweight.  Having too much fat, sugar, calories, or salt (sodium) in your diet.  Drinking too much alcohol. Factors that are difficult or impossible to change  Having chronic kidney disease.  Having a family history of high blood pressure.  Age. Risk increases with age.  Race. You may be at higher risk if you are African-American.  Gender. Men are at higher risk than women before age 45. After age 65, women are at higher risk than men.  Having obstructive sleep apnea.  Stress. What are the signs or symptoms? Extremely high blood pressure (hypertensive crisis) may cause:  Headache.  Anxiety.  Shortness of breath.  Nosebleed.  Nausea and vomiting.  Severe chest pain.  Jerky movements you cannot control (seizures).  How is this  diagnosed? This condition is diagnosed by measuring your blood pressure while you are seated, with your arm resting on a surface. The cuff of the blood pressure monitor will be placed directly against the skin of your upper arm at the level of your heart. It should be measured at least twice using the same arm. Certain conditions can cause a difference in blood pressure between your right and left arms. Certain factors can cause blood pressure readings to be lower or higher than normal (elevated) for a short period of time:  When your blood pressure is higher when you are in a health care provider's office than when you are at home, this is called white coat hypertension. Most people with this condition do not need medicines.  When your blood pressure is higher at home than when you are in a health care provider's office, this is called masked hypertension. Most people with this condition may need medicines to control blood pressure.  If you have a high blood pressure reading during one visit or you have normal blood pressure with other risk factors:  You may be asked to return on a different day to have your blood pressure checked again.  You may be asked to monitor your blood pressure at home for 1 week or longer.  If you are diagnosed with hypertension, you may have other blood or imaging tests to help your health care provider understand your overall risk for other conditions. How is this treated? This condition is treated by making healthy lifestyle changes, such as eating healthy foods, exercising more, and reducing your alcohol intake. Your   health care provider may prescribe medicine if lifestyle changes are not enough to get your blood pressure under control, and if:  Your systolic blood pressure is above 130.  Your diastolic blood pressure is above 80.  Your personal target blood pressure may vary depending on your medical conditions, your age, and other factors. Follow these  instructions at home: Eating and drinking  Eat a diet that is high in fiber and potassium, and low in sodium, added sugar, and fat. An example eating plan is called the DASH (Dietary Approaches to Stop Hypertension) diet. To eat this way: ? Eat plenty of fresh fruits and vegetables. Try to fill half of your plate at each meal with fruits and vegetables. ? Eat whole grains, such as whole wheat pasta, brown rice, or whole grain bread. Fill about one quarter of your plate with whole grains. ? Eat or drink low-fat dairy products, such as skim milk or low-fat yogurt. ? Avoid fatty cuts of meat, processed or cured meats, and poultry with skin. Fill about one quarter of your plate with lean proteins, such as fish, chicken without skin, beans, eggs, and tofu. ? Avoid premade and processed foods. These tend to be higher in sodium, added sugar, and fat.  Reduce your daily sodium intake. Most people with hypertension should eat less than 1,500 mg of sodium a day.  Limit alcohol intake to no more than 1 drink a day for nonpregnant women and 2 drinks a day for men. One drink equals 12 oz of beer, 5 oz of wine, or 1 oz of hard liquor. Lifestyle  Work with your health care provider to maintain a healthy body weight or to lose weight. Ask what an ideal weight is for you.  Get at least 30 minutes of exercise that causes your heart to beat faster (aerobic exercise) most days of the week. Activities may include walking, swimming, or biking.  Include exercise to strengthen your muscles (resistance exercise), such as pilates or lifting weights, as part of your weekly exercise routine. Try to do these types of exercises for 30 minutes at least 3 days a week.  Do not use any products that contain nicotine or tobacco, such as cigarettes and e-cigarettes. If you need help quitting, ask your health care provider.  Monitor your blood pressure at home as told by your health care provider.  Keep all follow-up visits as  told by your health care provider. This is important. Medicines  Take over-the-counter and prescription medicines only as told by your health care provider. Follow directions carefully. Blood pressure medicines must be taken as prescribed.  Do not skip doses of blood pressure medicine. Doing this puts you at risk for problems and can make the medicine less effective.  Ask your health care provider about side effects or reactions to medicines that you should watch for. Contact a health care provider if:  You think you are having a reaction to a medicine you are taking.  You have headaches that keep coming back (recurring).  You feel dizzy.  You have swelling in your ankles.  You have trouble with your vision. Get help right away if:  You develop a severe headache or confusion.  You have unusual weakness or numbness.  You feel faint.  You have severe pain in your chest or abdomen.  You vomit repeatedly.  You have trouble breathing. Summary  Hypertension is when the force of blood pumping through your arteries is too strong. If this condition is not   controlled, it may put you at risk for serious complications.  Your personal target blood pressure may vary depending on your medical conditions, your age, and other factors. For most people, a normal blood pressure is less than 120/80.  Hypertension is treated with lifestyle changes, medicines, or a combination of both. Lifestyle changes include weight loss, eating a healthy, low-sodium diet, exercising more, and limiting alcohol. This information is not intended to replace advice given to you by your health care provider. Make sure you discuss any questions you have with your health care provider. Document Released: 04/24/2005 Document Revised: 03/22/2016 Document Reviewed: 03/22/2016 Elsevier Interactive Patient Education  2018 [employer] DASH Eating Plan DASH stands for "Dietary Approaches to Stop Hypertension." The DASH  eating plan is a healthy eating plan that has been shown to reduce high blood pressure (hypertension). It may also reduce your risk for type 2 diabetes, heart disease, and stroke. The DASH eating plan may also help with weight loss. What are tips for following this plan? General guidelines  Avoid eating more than 2,300 mg (milligrams) of salt (sodium) a day. If you have hypertension, you may need to reduce your sodium intake to 1,500 mg a day.  Limit alcohol intake to no more than 1 drink a day for nonpregnant women and 2 drinks a day for men. One drink equals 12 oz of beer, 5 oz of wine, or 1 oz of hard liquor.  Work with your health care provider to maintain a healthy body weight or to lose weight. Ask what an ideal weight is for you.  Get at least 30 minutes of exercise that causes your heart to beat faster (aerobic exercise) most days of the week. Activities may include walking, swimming, or biking.  Work with your health care provider or diet and nutrition specialist (dietitian) to adjust your eating plan to your individual calorie needs. Reading food labels  Check food labels for the amount of sodium per serving. Choose foods with less than 5 percent of the Daily Value of sodium. Generally, foods with less than 300 mg of sodium per serving fit into this eating plan.  To find whole grains, look for the word "whole" as the first word in the ingredient list. Shopping  Buy products labeled as "low-sodium" or "no salt added."  Buy fresh foods. Avoid canned foods and premade or frozen meals. Cooking  Avoid adding salt when cooking. Use salt-free seasonings or herbs instead of table salt or sea salt. Check with your health care provider or pharmacist before using salt substitutes.  Do not fry foods. Cook foods using healthy methods such as baking, boiling, grilling, and broiling instead.  Cook with heart-healthy oils, such as olive, canola, soybean, or sunflower oil. Meal  planning   Eat a balanced diet that includes: ? 5 or more servings of fruits and vegetables each day. At each meal, try to fill half of your plate with fruits and vegetables. ? Up to 6-8 servings of whole grains each day. ? Less than 6 oz of lean meat, poultry, or fish each day. A 3-oz serving of meat is about the same size as a deck of cards. One egg equals 1 oz. ? 2 servings of low-fat dairy each day. ? A serving of nuts, seeds, or beans 5 times each week. ? Heart-healthy fats. Healthy fats called Omega-3 fatty acids are found in foods such as flaxseeds and coldwater fish, like sardines, salmon, and mackerel.  Limit how much you eat of the   the following: ? Canned or prepackaged foods. ? Food that is high in trans fat, such as fried foods. ? Food that is high in saturated fat, such as fatty meat. ? Sweets, desserts, sugary drinks, and other foods with added sugar. ? Full-fat dairy products.  Do not salt foods before eating.  Try to eat at least 2 vegetarian meals each week.  Eat more home-cooked food and less restaurant, buffet, and fast food.  When eating at a restaurant, ask that your food be prepared with less salt or no salt, if possible. What foods are recommended? The items listed may not be a complete list. Talk with your dietitian about what dietary choices are best for you. Grains Whole-grain or whole-wheat bread. Whole-grain or whole-wheat pasta. Brown rice. [place]. Bulgur. Whole-grain and low-sodium cereals. Pita bread. Low-fat, low-sodium crackers. Whole-wheat flour tortillas. Vegetables Fresh or frozen vegetables (raw, steamed, roasted, or grilled). Low-sodium or reduced-sodium tomato and vegetable juice. Low-sodium or reduced-sodium tomato sauce and tomato paste. Low-sodium or reduced-sodium canned vegetables. Fruits All fresh, dried, or frozen fruit. Canned fruit in natural juice (without added sugar). Meat and other protein foods Skinless chicken or [place].  Ground chicken or [place]. Pork with fat trimmed off. Fish and seafood. Egg whites. Dried beans, peas, or lentils. Unsalted nuts, nut butters, and seeds. Unsalted canned beans. Lean cuts of beef with fat trimmed off. Low-sodium, lean deli meat. Dairy Low-fat (1%) or fat-free (skim) milk. Fat-free, low-fat, or reduced-fat cheeses. Nonfat, low-sodium ricotta or cottage cheese. Low-fat or nonfat yogurt. Low-fat, low-sodium cheese. Fats and oils Soft margarine without trans fats. Vegetable oil. Low-fat, reduced-fat, or light mayonnaise and salad dressings (reduced-sodium). Canola, safflower, olive, soybean, and sunflower oils. Avocado. Seasoning and other foods Herbs. Spices. Seasoning mixes without salt. Unsalted popcorn and pretzels. Fat-free sweets. What foods are not recommended? The items listed may not be a complete list. Talk with your dietitian about what dietary choices are best for you. Grains Baked goods made with fat, such as croissants, muffins, or some breads. Dry pasta or rice meal packs. Vegetables Creamed or fried vegetables. Vegetables in a cheese sauce. Regular canned vegetables (not low-sodium or reduced-sodium). Regular canned tomato sauce and paste (not low-sodium or reduced-sodium). Regular tomato and vegetable juice (not low-sodium or reduced-sodium). Angie Fava. Olives. Fruits Canned fruit in a light or heavy syrup. Fried fruit. Fruit in cream or butter sauce. Meat and other protein foods Fatty cuts of meat. Ribs. Fried meat. Berniece Salines. Sausage. Bologna and other processed lunch meats. Salami. Fatback. Hotdogs. Bratwurst. Salted nuts and seeds. Canned beans with added salt. Canned or smoked fish. Whole eggs or egg yolks. Chicken or [place] with skin. Dairy Whole or 2% milk, cream, and half-and-half. Whole or full-fat cream cheese. Whole-fat or sweetened yogurt. Full-fat cheese. Nondairy creamers. Whipped toppings. Processed cheese and cheese spreads. Fats and oils Butter. Stick  margarine. Lard. Shortening. Ghee. Bacon fat. Tropical oils, such as coconut, palm kernel, or palm oil. Seasoning and other foods Salted popcorn and pretzels. Onion salt, garlic salt, seasoned salt, table salt, and sea salt. Worcestershire sauce. Tartar sauce. Barbecue sauce. Teriyaki sauce. Soy sauce, including reduced-sodium. Steak sauce. Canned and packaged gravies. Fish sauce. Oyster sauce. Cocktail sauce. Horseradish that you find on the shelf. Ketchup. Mustard. Meat flavorings and tenderizers. Bouillon cubes. Hot sauce and Tabasco sauce. Premade or packaged marinades. Premade or packaged taco seasonings. Relishes. Regular salad dressings. Where to find more information:  National Heart, Lung, and [place]: https://wilson-eaton.com/  American Heart Association:  www.heart.org Summary  The DASH eating plan is a healthy eating plan that has been shown to reduce high blood pressure (hypertension). It may also reduce your risk for type 2 diabetes, heart disease, and stroke.  With the DASH eating plan, you should limit salt (sodium) intake to 2,300 mg a day. If you have hypertension, you may need to reduce your sodium intake to 1,500 mg a day.  When on the DASH eating plan, aim to eat more fresh fruits and vegetables, whole grains, lean proteins, low-fat dairy, and heart-healthy fats.  Work with your health care provider or diet and nutrition specialist (dietitian) to adjust your eating plan to your individual calorie needs. This information is not intended to replace advice given to you by your health care provider. Make sure you discuss any questions you have with your health care provider. Document Released: 04/13/2011 Document Revised: 04/17/2016 Document Reviewed: 04/17/2016 Elsevier Interactive Patient Education  2018 [employer]  How to Take Your Blood Pressure You can take your blood pressure at home with a machine. You may need to check your blood pressure at home:  To check if you  have high blood pressure (hypertension).  To check your blood pressure over time.  To make sure your blood pressure medicine is working.  Supplies needed: You will need a blood pressure machine, or monitor. You can buy one at a drugstore or online. When choosing one:  Choose one with an arm cuff.  Choose one that wraps around your upper arm. Only one finger should fit between your arm and the cuff.  Do not choose one that measures your blood pressure from your wrist or finger.  Your doctor can suggest a monitor. How to prepare Avoid these things for 30 minutes before checking your blood pressure:  Drinking caffeine.  Drinking alcohol.  Eating.  Smoking.  Exercising.  Five minutes before checking your blood pressure:  Pee.  Sit in a dining chair. Avoid sitting in a soft couch or armchair.  Be quiet. Do not talk.  How to take your blood pressure Follow the instructions that came with your machine. If you have a digital blood pressure monitor, these may be the instructions: 1. Sit up straight. 2. Place your feet on the floor. Do not cross your ankles or legs. 3. Rest your left arm at the level of your heart. You may rest it on a table, desk, or chair. 4. Pull up your shirt sleeve. 5. Wrap the blood pressure cuff around the upper part of your left arm. The cuff should be 1 inch (2.5 cm) above your elbow. It is best to wrap the cuff around bare skin. 6. Fit the cuff snugly around your arm. You should be able to place only one finger between the cuff and your arm. 7. Put the cord inside the groove of your elbow. 8. Press the power button. 9. Sit quietly while the cuff fills with air and loses air. 10. Write down the numbers on the screen. 11. Wait 2-3 minutes and then repeat steps 1-10.  What do the numbers mean? Two numbers make up your blood pressure. The first number is called systolic pressure. The second is called diastolic pressure. An example of a blood pressure  reading is "120 over 80" (or 120/80). If you are an adult and do not have a medical condition, use this guide to find out if your blood pressure is normal: Normal  First number: below 120.  Second number: below 80. Elevated  First number: 120-129.  Second number: below 80. Hypertension stage 1  First number: 130-139.  Second number: 80-89. Hypertension stage 2  First number: 140 or above.  Second number: 90 or above. Your blood pressure is above normal even if only the top or bottom number is above normal. Follow these instructions at home:  Check your blood pressure as often as your doctor tells you to.  Take your monitor to your next doctor's appointment. Your doctor will: ? Make sure you are using it correctly. ? Make sure it is working right.  Make sure you understand what your blood pressure numbers should be.  Tell your doctor if your medicines are causing side effects. Contact a doctor if:  Your blood pressure keeps being high. Get help right away if:  Your first blood pressure number is higher than 180.  Your second blood pressure number is higher than 120. This information is not intended to replace advice given to you by your health care provider. Make sure you discuss any questions you have with your health care provider. Document Released: 04/06/2008 Document Revised: 03/22/2016 Document Reviewed: 10/01/2015 Elsevier Interactive Patient Education  2018 [employer].  How to Take Your Blood Pressure Blood pressure is a measurement of how strongly your blood is pressing against the walls of your arteries. Arteries are blood vessels that carry blood from your heart throughout your body. Your health care provider takes your blood pressure at each office visit. You can also take your own blood pressure at home with a blood pressure machine. You may need to take your own blood pressure:  To confirm a diagnosis of high blood pressure (hypertension).  To  monitor your blood pressure over time.  To make sure your blood pressure medicine is working.  Supplies needed: To take your blood pressure, you will need a blood pressure machine. You can buy a blood pressure machine, or blood pressure monitor, at most drugstores or online. There are several types of home blood pressure monitors. When choosing one, consider the following:  Choose a monitor that has an arm cuff.  Choose a monitor that wraps snugly around your upper arm. You should be able to fit only one finger between your arm and the cuff.  Do not choose a monitor that measures your blood pressure from your wrist or finger.  Your health care provider can suggest a reliable monitor that will meet your needs. How to prepare To get the most accurate reading, avoid the following for 30 minutes before you check your blood pressure:  Drinking caffeine.  Drinking alcohol.  Eating.  Smoking.  Exercising.  Five minutes before you check your blood pressure:  Empty your bladder.  Sit quietly without talking in a dining chair, rather than in a soft couch or armchair.  How to take your blood pressure To check your blood pressure, follow the instructions in the manual that came with your blood pressure monitor. If you have a digital blood pressure monitor, the instructions may be as follows: 1. Sit up straight. 2. Place your feet on the floor. Do not cross your ankles or legs. 3. Rest your left arm at the level of your heart on a table or desk or on the arm of a chair. 4. Pull up your shirt sleeve. 5. Wrap the blood pressure cuff around the upper part of your left arm, 1 inch (2.5 cm) above your elbow. It is best to wrap the cuff around bare skin. 6. Fit the cuff  snugly around your arm. You should be able to place only one finger between the cuff and your arm. 7. Position the cord inside the groove of your elbow. 8. Press the power button. 9. Sit quietly while the cuff inflates and  deflates. 10. Read the digital reading on the monitor screen and write it down (record it). 11. Wait 2-3 minutes, then repeat the steps, starting at step 1.  What does my blood pressure reading mean? A blood pressure reading consists of a higher number over a lower number. Ideally, your blood pressure should be below 120/80. The first ("top") number is called the systolic pressure. It is a measure of the pressure in your arteries as your heart beats. The second ("bottom") number is called the diastolic pressure. It is a measure of the pressure in your arteries as the heart relaxes. Blood pressure is classified into four stages. The following are the stages for adults who do not have a short-term serious illness or a chronic condition. Systolic pressure and diastolic pressure are measured in a unit called mm Hg. Normal  Systolic pressure: below 120.  Diastolic pressure: below 80. Elevated  Systolic pressure: 120-129.  Diastolic pressure: below 80. Hypertension stage 1  Systolic pressure: 130-139.  Diastolic pressure: 80-89. Hypertension stage 2  Systolic pressure: 140 or above.  Diastolic pressure: 90 or above. You can have prehypertension or hypertension even if only the systolic or only the diastolic number in your reading is higher than normal. Follow these instructions at home:  Check your blood pressure as often as recommended by your health care provider.  Take your monitor to the next appointment with your health care provider to make sure: ? That you are using it correctly. ? That it provides accurate readings.  Be sure you understand what your goal blood pressure numbers are.  Tell your health care provider if you are having any side effects from blood pressure medicine. Contact a health care provider if:  Your blood pressure is consistently high. Get help right away if:  Your systolic blood pressure is higher than 180.  Your diastolic blood pressure is higher  than 110. This information is not intended to replace advice given to you by your health care provider. Make sure you discuss any questions you have with your health care provider. Document Released: 10/01/2015 Document Revised: 12/14/2015 Document Reviewed: 10/01/2015 Elsevier Interactive Patient Education  2018 [employer].  Preventing Hypertension Hypertension, commonly called high blood pressure, is when the force of blood pumping through the arteries is too strong. Arteries are blood vessels that carry blood from the heart throughout the body. Over time, hypertension can damage the arteries and decrease blood flow to important parts of the body, including the brain, heart, and kidneys. Often, hypertension does not cause symptoms until blood pressure is very high. For this reason, it is important to have your blood pressure checked on a regular basis. Hypertension can often be prevented with diet and lifestyle changes. If you already have hypertension, you can control it with diet and lifestyle changes, as well as medicine. What nutrition changes can be made? Maintain a healthy diet. This includes:  Eating less salt (sodium). Ask your health care provider how much sodium is safe for you to have. The general recommendation is to consume less than 1 tsp (2,300 mg) of sodium a day. ? Do not add salt to your food. ? Choose low-sodium options when grocery shopping and eating out.  Limiting fats in your diet.  You can do this by eating low-fat or fat-free dairy products and by eating less red meat.  Eating more fruits, vegetables, and whole grains. Make a goal to eat: ? 1-2 cups of fresh fruits and vegetables each day. ? 3-4 servings of whole grains each day.  Avoiding foods and beverages that have added sugars.  Eating fish that contain healthy fats (omega-3 fatty acids), such as mackerel or salmon.  If you need help putting together a healthy eating plan, try the DASH diet. This diet is  high in fruits, vegetables, and whole grains. It is low in sodium, red meat, and added sugars. DASH stands for Dietary Approaches to Stop Hypertension. What lifestyle changes can be made?  Lose weight if you are overweight. Losing just 3?5% of your body weight can help prevent or control hypertension. ? For example, if your present weight is 200 lb (91 kg), a loss of 3-5% of your weight means losing 6-10 lb (2.7-4.5 kg). ? Ask your health care provider to help you with a diet and exercise plan to safely lose weight.  Get enough exercise. Do at least 150 minutes of moderate-intensity exercise each week. ? You could do this in short exercise sessions several times a day, or you could do longer exercise sessions a few times a week. For example, you could take a brisk 10-minute walk or bike ride, 3 times a day, for 5 days a week.  Find ways to reduce stress, such as exercising, meditating, listening to music, or taking a yoga class. If you need help reducing stress, ask your health care provider.  Do not smoke. This includes e-cigarettes. Chemicals in tobacco and nicotine products raise your blood pressure each time you smoke. If you need help quitting, ask your health care provider.  Avoid alcohol. If you drink alcohol, limit alcohol intake to no more than 1 drink a day for nonpregnant women and 2 drinks a day for men. One drink equals 12 oz of beer, 5 oz of wine, or 1 oz of hard liquor. Why are these changes important? Diet and lifestyle changes can help you prevent hypertension, and they may make you feel better overall and improve your quality of life. If you have hypertension, making these changes will help you control it and help prevent major complications, such as:  Hardening and narrowing of arteries that supply blood to: ? Your heart. This can cause a heart attack. ? Your brain. This can cause a stroke. ? Your kidneys. This can cause kidney failure.  Stress on your heart muscle, which  can cause heart failure.  What can I do to lower my risk?  Work with your health care provider to make a hypertension prevention plan that works for you. Follow your plan and keep all follow-up visits as told by your health care provider.  Learn how to check your blood pressure at home. Make sure that you know your personal target blood pressure, as told by your health care provider. How is this treated? In addition to diet and lifestyle changes, your health care provider may recommend medicines to help lower your blood pressure. You may need to try a few different medicines to find what works best for you. You also may need to take more than one medicine. Take over-the-counter and prescription medicines only as told by your health care provider. Where to find support: Your health care provider can help you prevent hypertension and help you keep your blood pressure at a healthy level.  Your local hospital or your community may also provide support services and prevention programs. The American Heart Association offers an online support network at: https://www.lee.net/ Where to find more information: Learn more about hypertension from:  National Heart, Lung, and Blood Institute: https://www.peterson.org/  Centers for Disease Control and Prevention: AboutHD.co.nz  American Academy of Family Physicians: http://familydoctor.org/familydoctor/en/diseases-conditions/high-blood-pressure.printerview.all.html  Learn more about the DASH diet from:  National Heart, Lung, and Blood Institute: WedMap.it  Contact a health care provider if:  You think you are having a reaction to medicines you have taken.  You have recurrent headaches or feel dizzy.  You have swelling in your ankles.  You have trouble with your vision. Summary  Hypertension often does not cause any symptoms until blood  pressure is very high. It is important to get your blood pressure checked regularly.  Diet and lifestyle changes are the most important steps in preventing hypertension.  By keeping your blood pressure in a healthy range, you can prevent complications like heart attack, heart failure, stroke, and kidney failure.  Work with your health care provider to make a hypertension prevention plan that works for you. This information is not intended to replace advice given to you by your health care provider. Make sure you discuss any questions you have with your health care provider. Document Released: 05/09/2015 Document Revised: 01/03/2016 Document Reviewed: 01/03/2016 Elsevier Interactive Patient Education  [employer].

## 2018-01-29 ENCOUNTER — Encounter: Payer: Self-pay | Admitting: Family Medicine

## 2018-01-29 DIAGNOSIS — E785 Hyperlipidemia, unspecified: Secondary | ICD-10-CM | POA: Insufficient documentation

## 2018-01-29 LAB — CMP12+LP+TP+TSH+6AC+PSA+CBC…
ALT: 36 IU/L (ref 0–44)
AST: 23 IU/L (ref 0–40)
Albumin/Globulin Ratio: 2.1 (ref 1.2–2.2)
Albumin: 4.8 g/dL (ref 3.5–5.5)
Alkaline Phosphatase: 87 IU/L (ref 39–117)
BASOS ABS: 0 10*3/uL (ref 0.0–0.2)
BUN/Creatinine Ratio: 12 (ref 9–20)
BUN: 13 mg/dL (ref 6–24)
Basos: 1 %
Bilirubin Total: 0.8 mg/dL (ref 0.0–1.2)
CALCIUM: 9.5 mg/dL (ref 8.7–10.2)
CHOL/HDL RATIO: 4 ratio (ref 0.0–5.0)
Chloride: 102 mmol/L (ref 96–106)
Cholesterol, Total: 239 mg/dL — ABNORMAL HIGH (ref 100–199)
Creatinine, Ser: 1.12 mg/dL (ref 0.76–1.27)
EOS (ABSOLUTE): 0.2 10*3/uL (ref 0.0–0.4)
ESTIMATED CHD RISK: 0.7 times avg. (ref 0.0–1.0)
Eos: 4 %
Free Thyroxine Index: 1.8 (ref 1.2–4.9)
GFR calc Af Amer: 90 mL/min/{1.73_m2} (ref >59)
GFR calc non Af Amer: 78 mL/min/{1.73_m2} (ref >59)
GGT: 16 IU/L (ref 0–65)
GLOBULIN, TOTAL: 2.3 g/dL (ref 1.5–4.5)
Glucose: 75 mg/dL (ref 65–99)
HDL: 60 mg/dL (ref >39)
HEMATOCRIT: 49.2 % (ref 37.5–51.0)
Hemoglobin: 16 g/dL (ref 13.0–17.7)
Immature Grans (Abs): 0 10*3/uL (ref 0.0–0.1)
Immature Granulocytes: 1 %
Iron: 130 ug/dL (ref 38–169)
LDH: 195 IU/L (ref 121–224)
LDL Calculated: 161 mg/dL — ABNORMAL HIGH (ref 0–99)
LYMPHS ABS: 1.6 10*3/uL (ref 0.7–3.1)
Lymphs: 36 %
MCH: 29 pg (ref 26.6–33.0)
MCHC: 32.5 g/dL (ref 31.5–35.7)
MCV: 89 fL (ref 79–97)
Monocytes Absolute: 0.3 10*3/uL (ref 0.1–0.9)
Monocytes: 6 %
Neutrophils Absolute: 2.3 10*3/uL (ref 1.4–7.0)
Neutrophils: 52 %
PHOSPHORUS: 2.5 mg/dL (ref 2.5–4.5)
PLATELETS: 253 10*3/uL (ref 150–450)
POTASSIUM: 4.2 mmol/L (ref 3.5–5.2)
Prostate Specific Ag, Serum: 0.5 ng/mL (ref 0.0–4.0)
RBC: 5.51 x10E6/uL (ref 4.14–5.80)
RDW: 13 % (ref 12.3–15.4)
Sodium: 142 mmol/L (ref 134–144)
T3 Uptake Ratio: 28 % (ref 24–39)
T4 TOTAL: 6.6 ug/dL (ref 4.5–12.0)
TSH: 3.01 u[IU]/mL (ref 0.450–4.500)
Total Protein: 7.1 g/dL (ref 6.0–8.5)
Triglycerides: 92 mg/dL (ref 0–149)
URIC ACID: 5.9 mg/dL (ref 3.7–8.6)
VLDL Cholesterol Cal: 18 mg/dL (ref 5–40)
WBC: 4.3 10*3/uL (ref 3.4–10.8)

## 2018-01-29 LAB — HGB A1C W/O EAG: HEMOGLOBIN A1C: 5.5 % (ref 4.8–5.6)

## 2018-01-30 ENCOUNTER — Telehealth: Payer: Self-pay | Admitting: Family Medicine

## 2018-01-30 NOTE — Telephone Encounter (Signed)
Copied from CRM [id number]. Topic: Quick Communication - Lab Results >> Jan 30, 2018 12:31 PM Self, Dayton Scrape, CMA wrote: Called patient to inform them of his lab results. When patient returns call, triage nurse may disclose results.  Pt calling back for results.  Please call

## 2018-01-31 ENCOUNTER — Ambulatory Visit: Payer: Self-pay | Admitting: Registered Nurse

## 2018-01-31 ENCOUNTER — Encounter: Payer: Self-pay | Admitting: Registered Nurse

## 2018-01-31 VITALS — BP 137/93 | HR 52 | Temp 97.9°F

## 2018-01-31 DIAGNOSIS — J019 Acute sinusitis, unspecified: Secondary | ICD-10-CM

## 2018-01-31 DIAGNOSIS — R030 Elevated blood-pressure reading, without diagnosis of hypertension: Secondary | ICD-10-CM

## 2018-01-31 MED ORDER — FLUTICASONE PROPIONATE 50 MCG/ACT NA SUSP: 1.0000 | Freq: Two times a day (BID) | NASAL | 6 refills | Status: DC

## 2018-01-31 MED ORDER — ACETAMINOPHEN 500 MG PO TABS: 1000.0000 mg | ORAL_TABLET | Freq: Four times a day (QID) | ORAL | 0 refills | Status: AC | PRN

## 2018-01-31 MED ORDER — SALINE SPRAY 0.65 % NA SOLN: 2.0000 | NASAL | 0 refills | Status: DC

## 2018-01-31 NOTE — Patient Instructions (Addendum)
Hypertension Hypertension, commonly called high blood pressure, is when the force of blood pumping through the arteries is too strong. The arteries are the blood vessels that carry blood from the heart throughout the body. Hypertension forces the heart to work harder to pump blood and may cause arteries to become narrow or stiff. Having untreated or uncontrolled hypertension can cause heart attacks, strokes, kidney disease, and other problems. A blood pressure reading consists of a higher number over a lower number. Ideally, your blood pressure should be below 120/80. The first ("top") number is called the systolic pressure. It is a measure of the pressure in your arteries as your heart beats. The second ("bottom") number is called the diastolic pressure. It is a measure of the pressure in your arteries as the heart relaxes. What are the causes? The cause of this condition is not known. What increases the risk? Some risk factors for high blood pressure are under your control. Others are not. Factors you can change  Smoking.  Having type 2 diabetes mellitus, high cholesterol, or both.  Not getting enough exercise or physical activity.  Being overweight.  Having too much fat, sugar, calories, or salt (sodium) in your diet.  Drinking too much alcohol. Factors that are difficult or impossible to change  Having chronic kidney disease.  Having a family history of high blood pressure.  Age. Risk increases with age.  Race. You may be at higher risk if you are African-American.  Gender. Men are at higher risk than women before age 45. After age 65, women are at higher risk than men.  Having obstructive sleep apnea.  Stress. What are the signs or symptoms? Extremely high blood pressure (hypertensive crisis) may cause:  Headache.  Anxiety.  Shortness of breath.  Nosebleed.  Nausea and vomiting.  Severe chest pain.  Jerky movements you cannot control (seizures).  How is this  diagnosed? This condition is diagnosed by measuring your blood pressure while you are seated, with your arm resting on a surface. The cuff of the blood pressure monitor will be placed directly against the skin of your upper arm at the level of your heart. It should be measured at least twice using the same arm. Certain conditions can cause a difference in blood pressure between your right and left arms. Certain factors can cause blood pressure readings to be lower or higher than normal (elevated) for a short period of time:  When your blood pressure is higher when you are in a health care provider's office than when you are at home, this is called white coat hypertension. Most people with this condition do not need medicines.  When your blood pressure is higher at home than when you are in a health care provider's office, this is called masked hypertension. Most people with this condition may need medicines to control blood pressure.  If you have a high blood pressure reading during one visit or you have normal blood pressure with other risk factors:  You may be asked to return on a different day to have your blood pressure checked again.  You may be asked to monitor your blood pressure at home for 1 week or longer.  If you are diagnosed with hypertension, you may have other blood or imaging tests to help your health care provider understand your overall risk for other conditions. How is this treated? This condition is treated by making healthy lifestyle changes, such as eating healthy foods, exercising more, and reducing your alcohol intake. Your   health care provider may prescribe medicine if lifestyle changes are not enough to get your blood pressure under control, and if:  Your systolic blood pressure is above 130.  Your diastolic blood pressure is above 80.  Your personal target blood pressure may vary depending on your medical conditions, your age, and other factors. Follow these  instructions at home: Eating and drinking  Eat a diet that is high in fiber and potassium, and low in sodium, added sugar, and fat. An example eating plan is called the DASH (Dietary Approaches to Stop Hypertension) diet. To eat this way: ? Eat plenty of fresh fruits and vegetables. Try to fill half of your plate at each meal with fruits and vegetables. ? Eat whole grains, such as whole wheat pasta, brown rice, or whole grain bread. Fill about one quarter of your plate with whole grains. ? Eat or drink low-fat dairy products, such as skim milk or low-fat yogurt. ? Avoid fatty cuts of meat, processed or cured meats, and poultry with skin. Fill about one quarter of your plate with lean proteins, such as fish, chicken without skin, beans, eggs, and tofu. ? Avoid premade and processed foods. These tend to be higher in sodium, added sugar, and fat.  Reduce your daily sodium intake. Most people with hypertension should eat less than 1,500 mg of sodium a day.  Limit alcohol intake to no more than 1 drink a day for nonpregnant women and 2 drinks a day for men. One drink equals 12 oz of beer, 5 oz of wine, or 1 oz of hard liquor. Lifestyle  Work with your health care provider to maintain a healthy body weight or to lose weight. Ask what an ideal weight is for you.  Get at least 30 minutes of exercise that causes your heart to beat faster (aerobic exercise) most days of the week. Activities may include walking, swimming, or biking.  Include exercise to strengthen your muscles (resistance exercise), such as pilates or lifting weights, as part of your weekly exercise routine. Try to do these types of exercises for 30 minutes at least 3 days a week.  Do not use any products that contain nicotine or tobacco, such as cigarettes and e-cigarettes. If you need help quitting, ask your health care provider.  Monitor your blood pressure at home as told by your health care provider.  Keep all follow-up visits as  told by your health care provider. This is important. Medicines  Take over-the-counter and prescription medicines only as told by your health care provider. Follow directions carefully. Blood pressure medicines must be taken as prescribed.  Do not skip doses of blood pressure medicine. Doing this puts you at risk for problems and can make the medicine less effective.  Ask your health care provider about side effects or reactions to medicines that you should watch for. Contact a health care provider if:  You think you are having a reaction to a medicine you are taking.  You have headaches that keep coming back (recurring).  You feel dizzy.  You have swelling in your ankles.  You have trouble with your vision. Get help right away if:  You develop a severe headache or confusion.  You have unusual weakness or numbness.  You feel faint.  You have severe pain in your chest or abdomen.  You vomit repeatedly.  You have trouble breathing. Summary  Hypertension is when the force of blood pumping through your arteries is too strong. If this condition is not   controlled, it may put you at risk for serious complications.  Your personal target blood pressure may vary depending on your medical conditions, your age, and other factors. For most people, a normal blood pressure is less than 120/80.  Hypertension is treated with lifestyle changes, medicines, or a combination of both. Lifestyle changes include weight loss, eating a healthy, low-sodium diet, exercising more, and limiting alcohol. This information is not intended to replace advice given to you by your health care provider. Make sure you discuss any questions you have with your health care provider. Document Released: 04/24/2005 Document Revised: 03/22/2016 Document Reviewed: 03/22/2016 Elsevier Interactive Patient Education  2018 [employer]. Nonallergic Rhinitis  1. Nonallergic rhinitis is a term used by allergist to describe  inflammation in the nose that is not due to an allergic source (i.e., pollens, mold, animal dander or dust mites). 2. Nonallergic rhinitis can mimic many of the symptoms caused by allergies.  This includes a runny nose ("rhinorrhea"), sneezing, congestion, ear fullness and post nasal drip. 3. These symptoms usually occur year round but can be made worse by many environmental factors including weather change, irritants such as cigarette smoke, fumes, perfumes, detergents and many others. These are not allergens, but are irritants, and do not cause the formation of antibodies like true allergens.  For this reason most people with nonallergic rhinitis have negative skin tests. 4. Unfortunately, we have a poor understanding of what causes nonallergic rhinitis but certain factors such as deviated septum, sinusitis and nasal polyps may contribute to the symptoms.  Due to the poor understanding of what causes nonallergic rhinitis it cannot be cured and our treatment is only symptomatic to control symptoms. 5. Important factors in the successful treatment of nonallergic rhinitis include avoidance of the irritants that cause symptoms; for example, people who continue to smoke may never improve.  Continuous therapy works better than intermittent.  This may mean taking medications once daily or 3-4 times a day. Helpful treatments include nasal saline lavage, nasal ipratropium (Atrovent), nasal steroids, and decongestants.  Pure antihistamines don't seem to work very well.  Immunotherapy (allergy shots) has no role in the treatment of nonallergic rhinitis.  Several medications may have to be tried before a good combination is found for you.  These medications, in general, are safe for long term use.  Tolerance may develop to the therapeutic effects of these medications: Frequently changing between several helpful medications can prevent this should it occur.Sinusitis, Adult Sinusitis is soreness and inflammation of your  sinuses. Sinuses are hollow spaces in the bones around your face. Your sinuses are located:  Around your eyes.  In the middle of your forehead.  Behind your nose.  In your cheekbones.  Your sinuses and nasal passages are lined with a stringy fluid (mucus). Mucus normally drains out of your sinuses. When your nasal tissues become inflamed or swollen, the mucus can become trapped or blocked so air cannot flow through your sinuses. This allows bacteria, viruses, and funguses to grow, which leads to infection. Sinusitis can develop quickly and last for 7?10 days (acute) or for more than 12 weeks (chronic). Sinusitis often develops after a cold. What are the causes? This condition is caused by anything that creates swelling in the sinuses or stops mucus from draining, including:  Allergies.  Asthma.  Bacterial or viral infection.  Abnormally shaped bones between the nasal passages.  Nasal growths that contain mucus (nasal polyps).  Narrow sinus openings.  Pollutants, such as chemicals or irritants in  the air.  A foreign object stuck in the nose.  A fungal infection. This is rare.  What increases the risk? The following factors may make you more likely to develop this condition:  Having allergies or asthma.  Having had a recent cold or respiratory tract infection.  Having structural deformities or blockages in your nose or sinuses.  Having a weak immune system.  Doing a lot of swimming or diving.  Overusing nasal sprays.  Smoking.  What are the signs or symptoms? The main symptoms of this condition are pain and a feeling of pressure around the affected sinuses. Other symptoms include:  Upper toothache.  Earache.  Headache.  Bad breath.  Decreased sense of smell and taste.  A cough that may get worse at night.  Fatigue.  Fever.  Thick drainage from your nose. The drainage is often green and it may contain pus (purulent).  Stuffy nose or  congestion.  Postnasal drip. This is when extra mucus collects in the throat or back of the nose.  Swelling and warmth over the affected sinuses.  Sore throat.  Sensitivity to light.  How is this diagnosed? This condition is diagnosed based on symptoms, a medical history, and a physical exam. To find out if your condition is acute or chronic, your health care provider may:  Look in your nose for signs of nasal polyps.  Tap over the affected sinus to check for signs of infection.  View the inside of your sinuses using an imaging device that has a light attached (endoscope).  If your health care provider suspects that you have chronic sinusitis, you may also:  Be tested for allergies.  Have a sample of mucus taken from your nose (nasal culture) and checked for bacteria.  Have a mucus sample examined to see if your sinusitis is related to an allergy.  If your sinusitis does not respond to treatment and it lasts longer than 8 weeks, you may have an MRI or CT scan to check your sinuses. These scans also help to determine how severe your infection is. In rare cases, a bone biopsy may be done to rule out more serious types of fungal sinus disease. How is this treated? Treatment for sinusitis depends on the cause and whether your condition is chronic or acute. If a virus is causing your sinusitis, your symptoms will go away on their own within 10 days. You may be given medicines to relieve your symptoms, including:  Topical nasal decongestants. They shrink swollen nasal passages and let mucus drain from your sinuses.  Antihistamines. These drugs block inflammation that is triggered by allergies. This can help to ease swelling in your nose and sinuses.  Topical nasal corticosteroids. These are nasal sprays that ease inflammation and swelling in your nose and sinuses.  Nasal saline washes. These rinses can help to get rid of thick mucus in your nose.  If your condition is caused by  bacteria, you will be given an antibiotic medicine. If your condition is caused by a fungus, you will be given an antifungal medicine. Surgery may be needed to correct underlying conditions, such as narrow nasal passages. Surgery may also be needed to remove polyps. Follow these instructions at home: Medicines  Take, use, or apply over-the-counter and prescription medicines only as told by your health care provider. These may include nasal sprays.  If you were prescribed an antibiotic medicine, take it as told by your health care provider. Do not stop taking the antibiotic even if  you start to feel better. Hydrate and Humidify  Drink enough water to keep your urine clear or pale yellow. Staying hydrated will help to thin your mucus.  Use a cool mist humidifier to keep the humidity level in your home above 50%.  Inhale steam for 10-15 minutes, 3-4 times a day or as told by your health care provider. You can do this in the bathroom while a hot shower is running.  Limit your exposure to cool or dry air. Rest  Rest as much as possible.  Sleep with your head raised (elevated).  Make sure to get enough sleep each night. General instructions  Apply a warm, moist washcloth to your face 3-4 times a day or as told by your health care provider. This will help with discomfort.  Wash your hands often with soap and water to reduce your exposure to viruses and other germs. If soap and water are not available, use hand sanitizer.  Do not smoke. Avoid being around people who are smoking (secondhand smoke).  Keep all follow-up visits as told by your health care provider. This is important. Contact a health care provider if:  You have a fever.  Your symptoms get worse.  Your symptoms do not improve within 10 days. Get help right away if:  You have a severe headache.  You have persistent vomiting.  You have pain or swelling around your face or eyes.  You have vision problems.  You  develop confusion.  Your neck is stiff.  You have trouble breathing. This information is not intended to replace advice given to you by your health care provider. Make sure you discuss any questions you have with your health care provider. Document Released: 04/24/2005 Document Revised: 12/19/2015 Document Reviewed: 02/17/2015 Elsevier Interactive Patient Education  2018 [employer]. Sinus Rinse What is a sinus rinse? A sinus rinse is a simple home treatment that is used to rinse your sinuses with a sterile mixture of salt and water (saline solution). Sinuses are air-filled spaces in your skull behind the bones of your face and forehead that open into your nasal cavity. You will use the following:  Saline solution.  Neti pot or spray bottle. This releases the saline solution into your nose and through your sinuses. Neti pots and spray bottles can be purchased at [employer], a health food store, or online.  When would I do a sinus rinse? A sinus rinse can help to clear mucus, dirt, dust, or pollen from the nasal cavity. You may do a sinus rinse when you have a cold, a virus, nasal allergy symptoms, a sinus infection, or stuffiness in the nose or sinuses. If you are considering a sinus rinse:  Ask your child's health care provider before performing a sinus rinse on your child.  Do not do a sinus rinse if you have had ear or nasal surgery, ear infection, or blocked ears.  How do I do a sinus rinse?  Wash your hands.  Disinfect your device according to the directions provided and then dry it.  Use the solution that comes with your device or one that is sold separately in stores. Follow the mixing directions on the package.  Fill your device with the amount of saline solution as directed by the device instructions.  Stand over a sink and tilt your head sideways over the sink.  Place the spout of the device in your upper nostril (the one closer to the ceiling).  Gently  pour or squeeze the  saline solution into the nasal cavity. The liquid should drain to the lower nostril if you are not overly congested.  Gently blow your nose. Blowing too hard may cause ear pain.  Repeat in the other nostril.  Clean and rinse your device with clean water and then air-dry it. Are there risks of a sinus rinse? Sinus rinse is generally very safe and effective. However, there are a few risks, which include:  A burning sensation in the sinuses. This may happen if you do not make the saline solution as directed. Make sure to follow all directions when making the saline solution.  Infection from contaminated water. This is rare, but possible.  Nasal irritation.  This information is not intended to replace advice given to you by your health care provider. Make sure you discuss any questions you have with your health care provider. Document Released: 11/19/2013 Document Revised: 03/21/2016 Document Reviewed: 09/09/2013 Elsevier Interactive Patient Education  2017 [employer].

## 2018-01-31 NOTE — Progress Notes (Signed)
Subjective:    Patient ID: John [name], male    DOB: 04/11/1971, 47 y.o.   MRN: [id number]  47y/o established male pt c/o nasal congestion, rhinorrhea, PND, sore throat, frontal sinus pressure and HA.Not taking any otcs at home currently as he is being evaluated by new pcp for HTN and was concerned about taking any meds that may make it worse. Given 500mg  Tylenol approx 2 hours ago by Johnna Acosta for HA and he reports it is improved now.  Daughter and coworkers with URI symptoms also  Denied seasonal allergies  Started saline nasal today      Review of Systems  Constitutional: Positive for fatigue. Negative for activity change, appetite change, chills, diaphoresis, fever and unexpected weight change.  HENT: Positive for congestion, postnasal drip, rhinorrhea, sinus pressure and sore throat. Negative for dental problem, drooling, ear discharge, ear pain, facial swelling, hearing loss, mouth sores, nosebleeds, sinus pain, sneezing, tinnitus, trouble swallowing and voice change.   Eyes: Negative for photophobia, pain, discharge, redness, itching and visual disturbance.  Respiratory: Negative for cough, choking, chest tightness, shortness of breath, wheezing and stridor.   Cardiovascular: Negative for chest pain, palpitations and leg swelling.  Gastrointestinal: Negative for abdominal distention, abdominal pain, blood in stool, constipation, diarrhea, nausea and vomiting.  Endocrine: Negative for cold intolerance and heat intolerance.  Genitourinary: Negative for dysuria.  Musculoskeletal: Negative for arthralgias, back pain, gait problem, joint swelling, myalgias, neck pain and neck stiffness.  Skin: Negative for color change, pallor, rash and wound.  Allergic/Immunologic: Negative for environmental allergies, food allergies and immunocompromised state.  Neurological: Positive for headaches. Negative for dizziness, tremors, seizures, syncope, facial asymmetry, speech difficulty, weakness,  light-headedness and numbness.  Hematological: Negative for adenopathy. Does not bruise/bleed easily.  Psychiatric/Behavioral: Negative for agitation, behavioral problems, confusion and sleep disturbance.       Objective:   Physical Exam  Constitutional: He is oriented to person, place, and time. Vital signs are normal. He appears well-developed and well-nourished. He is active and cooperative.  Non-toxic appearance. He does not have a sickly appearance. He appears ill. No distress.  HENT:  Head: Normocephalic and atraumatic.  Right Ear: Hearing, external ear and ear canal normal. A middle ear effusion is present.  Left Ear: Hearing, external ear and ear canal normal. A middle ear effusion is present.  Nose: Mucosal edema and rhinorrhea present. No nose lacerations, sinus tenderness, nasal deformity, septal deviation or nasal septal hematoma. No epistaxis.  No foreign bodies. Right sinus exhibits no maxillary sinus tenderness and no frontal sinus tenderness. Left sinus exhibits no maxillary sinus tenderness and no frontal sinus tenderness.  Mouth/Throat: Uvula is midline and mucous membranes are normal. Mucous membranes are not pale, not dry and not cyanotic. He does not have dentures. No oral lesions. No trismus in the jaw. Normal dentition. No dental abscesses, uvula swelling, lacerations or dental caries. Posterior oropharyngeal edema and posterior oropharyngeal erythema present. No oropharyngeal exudate or tonsillar abscesses. No tonsillar exudate.  Cobblestoning posterior pharynx; bilateral TMs air fluid level clear; bilateral allergic shiners; clear discharge bilateral nasal turbinates with edema/erythema  Eyes: Pupils are equal, round, and reactive to light. Conjunctivae, EOM and lids are normal. Right eye exhibits no chemosis, no discharge, no exudate and no hordeolum. No foreign body present in the right eye. Left eye exhibits no chemosis, no discharge, no exudate and no hordeolum. No  foreign body present in the left eye. Right conjunctiva is not injected. Right conjunctiva has no hemorrhage. Left  conjunctiva is not injected. Left conjunctiva has no hemorrhage. No scleral icterus. Right eye exhibits normal extraocular motion and no nystagmus. Left eye exhibits normal extraocular motion and no nystagmus. Right pupil is round and reactive. Left pupil is round and reactive. Pupils are equal.  Neck: Trachea normal, normal range of motion and phonation normal. Neck supple. No tracheal tenderness, no spinous process tenderness and no muscular tenderness present. No neck rigidity. No tracheal deviation, no edema, no erythema and normal range of motion present. No thyroid mass and no thyromegaly present.  Cardiovascular: Normal rate, regular rhythm, S1 normal, S2 normal, normal heart sounds and intact distal pulses. PMI is not displaced. Exam reveals no gallop and no friction rub.  No murmur heard. Pulmonary/Chest: Effort normal and breath sounds normal. No stridor. No respiratory distress. He has no decreased breath sounds. He has no wheezes. He has no rhonchi. He has no rales.  No cough observed in exam room; spoke full sentences without difficulty;   Abdominal: Soft. Normal appearance. He exhibits no distension. There is no rigidity and no guarding.  Musculoskeletal: Normal range of motion. He exhibits no edema or tenderness.       Right shoulder: Normal.       Left shoulder: Normal.       Right elbow: Normal.      Left elbow: Normal.       Right hip: Normal.       Left hip: Normal.       Right knee: Normal.       Left knee: Normal.       Cervical back: Normal.       Thoracic back: Normal.       Lumbar back: Normal.       Right hand: Normal.       Left hand: Normal.  Lymphadenopathy:       Head (right side): No submental, no submandibular, no tonsillar, no preauricular, no posterior auricular and no occipital adenopathy present.       Head (left side): No submental, no  submandibular, no tonsillar, no preauricular, no posterior auricular and no occipital adenopathy present.    He has no cervical adenopathy.       Right cervical: No superficial cervical, no deep cervical and no posterior cervical adenopathy present.      Left cervical: No superficial cervical, no deep cervical and no posterior cervical adenopathy present.  Neurological: He is alert and oriented to person, place, and time. He has normal strength. He is not disoriented. He displays no atrophy and no tremor. No cranial nerve deficit or sensory deficit. He exhibits normal muscle tone. He displays no seizure activity. Coordination and gait normal. GCS eye subscore is 4. GCS verbal subscore is 5. GCS motor subscore is 6.  On/off exam table; in/out of chair without difficulty; gait sure and steady in hallway  Skin: Skin is warm, dry and intact. Capillary refill takes less than 2 seconds. No abrasion, no bruising, no burn, no ecchymosis, no laceration, no lesion, no petechiae and no rash noted. He is not diaphoretic. No cyanosis or erythema. No pallor. Nails show no clubbing.  Psychiatric: He has a normal mood and affect. His speech is normal and behavior is normal. Judgment and thought content normal. He is not actively hallucinating. Cognition and memory are normal. He is attentive.  Nursing note and vitals reviewed.         Assessment & Plan:  A-acute rhinosinusitis, elevated blood pressure  P-follow up with  PCM for elevated blood pressure.  Improved from the past week but still elevated and patient with acute illness URI Avoid sudafed OTC as will raise blood pressure.  If chest pain, shortness of breath, visual changes, worst headache of life seek re-evaluation ER. exitcare handout hypertension Patient verbalized understanding information/instructions, agreed with plan of care and had no further questions at this time.  Patient may use normal saline nasal spray 2 sprays each nostril q2h wa as needed.  flonase 1 spray each nostril BID #1 RF6 electronic rx to his pharmacy of choice.  Patient denied personal or family history of ENT cancer.  OTC antihistamine of choice claritin/zyrtec 10mg  po daily.  Avoid triggers if possible.  Shower prior to bedtime if exposed to triggers.  If allergic dust/dust mites recommend mattress/pillow covers/encasements; washing linens, vacuuming, sweeping, dusting weekly.  Call or return to clinic as needed if these symptoms worsen or fail to improve as anticipated.   Exitcare handout on nonallergic rhinitis and sinus rinse.  Patient verbalized understanding of instructions, agreed with plan of care and had no further questions at this time.  P2:  Avoidance and hand washing.  Tylenol 1000mg  po QID prn pain 8 UD from clinic stock given to patient.  Restart flonase 1 spray each nostril BID #1 RF6, saline 2 sprays each nostril q2h wa prn congestion given 1 bottle from clinic stock.  Consider augmentin 875mg  po BID x 10 days if no improvement over the weekend..  Denied personal or family history of ENT cancer.  Shower BID especially prior to bed. No evidence of systemic bacterial infection, non toxic and well hydrated.  I do not see where any further testing or imaging is necessary at this time.   I will suggest supportive care, rest, good hygiene and encourage the patient to take adequate fluids.  The patient is to return to clinic or EMERGENCY ROOM if symptoms worsen or change significantly.  Exitcare handout on sinusitis and sinus rinse.  Patient verbalized agreement and understanding of treatment plan and had no further questions at this time.   P2:  Hand washing and cover cough

## 2018-02-05 ENCOUNTER — Ambulatory Visit: Payer: Self-pay | Admitting: Registered Nurse

## 2018-02-05 VITALS — BP 129/87 | HR 59 | Temp 96.6°F

## 2018-02-05 DIAGNOSIS — H8113 Benign paroxysmal vertigo, bilateral: Secondary | ICD-10-CM

## 2018-02-05 DIAGNOSIS — R030 Elevated blood-pressure reading, without diagnosis of hypertension: Secondary | ICD-10-CM

## 2018-02-05 DIAGNOSIS — H6593 Unspecified nonsuppurative otitis media, bilateral: Secondary | ICD-10-CM

## 2018-02-05 DIAGNOSIS — J0101 Acute recurrent maxillary sinusitis: Secondary | ICD-10-CM

## 2018-02-05 MED ORDER — MECLIZINE HCL 25 MG PO TABS: 25.0000 mg | ORAL_TABLET | Freq: Four times a day (QID) | ORAL | 0 refills | Status: AC | PRN

## 2018-02-05 MED ORDER — LORATADINE 10 MG PO TABS: 10.0000 mg | ORAL_TABLET | Freq: Every day | ORAL | 11 refills | Status: DC

## 2018-02-05 MED ORDER — AMOXICILLIN-POT CLAVULANATE 875-125 MG PO TABS: 1.0000 | ORAL_TABLET | Freq: Two times a day (BID) | ORAL | 0 refills | Status: DC

## 2018-02-05 MED ORDER — ACETAMINOPHEN 500 MG PO TABS: 1000.0000 mg | ORAL_TABLET | Freq: Four times a day (QID) | ORAL | 0 refills | Status: AC | PRN

## 2018-02-05 NOTE — Patient Instructions (Addendum)
Sinusitis, Adult Sinusitis is soreness and inflammation of your sinuses. Sinuses are hollow spaces in the bones around your face. Your sinuses are located:  Around your eyes.  In the middle of your forehead.  Behind your nose.  In your cheekbones.  Your sinuses and nasal passages are lined with a stringy fluid (mucus). Mucus normally drains out of your sinuses. When your nasal tissues become inflamed or swollen, the mucus can become trapped or blocked so air cannot flow through your sinuses. This allows bacteria, viruses, and funguses to grow, which leads to infection. Sinusitis can develop quickly and last for 7?10 days (acute) or for more than 12 weeks (chronic). Sinusitis often develops after a cold. What are the causes? This condition is caused by anything that creates swelling in the sinuses or stops mucus from draining, including:  Allergies.  Asthma.  Bacterial or viral infection.  Abnormally shaped bones between the nasal passages.  Nasal growths that contain mucus (nasal polyps).  Narrow sinus openings.  Pollutants, such as chemicals or irritants in the air.  A foreign object stuck in the nose.  A fungal infection. This is rare.  What increases the risk? The following factors may make you more likely to develop this condition:  Having allergies or asthma.  Having had a recent cold or respiratory tract infection.  Having structural deformities or blockages in your nose or sinuses.  Having a weak immune system.  Doing a lot of swimming or diving.  Overusing nasal sprays.  Smoking.  What are the signs or symptoms? The main symptoms of this condition are pain and a feeling of pressure around the affected sinuses. Other symptoms include:  Upper toothache.  Earache.  Headache.  Bad breath.  Decreased sense of smell and taste.  A cough that may get worse at night.  Fatigue.  Fever.  Thick drainage from your nose. The drainage is often green and  it may contain pus (purulent).  Stuffy nose or congestion.  Postnasal drip. This is when extra mucus collects in the throat or back of the nose.  Swelling and warmth over the affected sinuses.  Sore throat.  Sensitivity to light.  How is this diagnosed? This condition is diagnosed based on symptoms, a medical history, and a physical exam. To find out if your condition is acute or chronic, your health care provider may:  Look in your nose for signs of nasal polyps.  Tap over the affected sinus to check for signs of infection.  View the inside of your sinuses using an imaging device that has a light attached (endoscope).  If your health care provider suspects that you have chronic sinusitis, you may also:  Be tested for allergies.  Have a sample of mucus taken from your nose (nasal culture) and checked for bacteria.  Have a mucus sample examined to see if your sinusitis is related to an allergy.  If your sinusitis does not respond to treatment and it lasts longer than 8 weeks, you may have an MRI or CT scan to check your sinuses. These scans also help to determine how severe your infection is. In rare cases, a bone biopsy may be done to rule out more serious types of fungal sinus disease. How is this treated? Treatment for sinusitis depends on the cause and whether your condition is chronic or acute. If a virus is causing your sinusitis, your symptoms will go away on their own within 10 days. You may be given medicines to relieve your symptoms,   including:  Topical nasal decongestants. They shrink swollen nasal passages and let mucus drain from your sinuses.  Antihistamines. These drugs block inflammation that is triggered by allergies. This can help to ease swelling in your nose and sinuses.  Topical nasal corticosteroids. These are nasal sprays that ease inflammation and swelling in your nose and sinuses.  Nasal saline washes. These rinses can help to get rid of thick mucus in  your nose.  If your condition is caused by bacteria, you will be given an antibiotic medicine. If your condition is caused by a fungus, you will be given an antifungal medicine. Surgery may be needed to correct underlying conditions, such as narrow nasal passages. Surgery may also be needed to remove polyps. Follow these instructions at home: Medicines  Take, use, or apply over-the-counter and prescription medicines only as told by your health care provider. These may include nasal sprays.  If you were prescribed an antibiotic medicine, take it as told by your health care provider. Do not stop taking the antibiotic even if you start to feel better. Hydrate and Humidify  Drink enough water to keep your urine clear or pale yellow. Staying hydrated will help to thin your mucus.  Use a cool mist humidifier to keep the humidity level in your home above 50%.  Inhale steam for 10-15 minutes, 3-4 times a day or as told by your health care provider. You can do this in the bathroom while a hot shower is running.  Limit your exposure to cool or dry air. Rest  Rest as much as possible.  Sleep with your head raised (elevated).  Make sure to get enough sleep each night. General instructions  Apply a warm, moist washcloth to your face 3-4 times a day or as told by your health care provider. This will help with discomfort.  Wash your hands often with soap and water to reduce your exposure to viruses and other germs. If soap and water are not available, use hand sanitizer.  Do not smoke. Avoid being around people who are smoking (secondhand smoke).  Keep all follow-up visits as told by your health care provider. This is important. Contact a health care provider if:  You have a fever.  Your symptoms get worse.  Your symptoms do not improve within 10 days. Get help right away if:  You have a severe headache.  You have persistent vomiting.  You have pain or swelling around your face or  eyes.  You have vision problems.  You develop confusion.  Your neck is stiff.  You have trouble breathing. This information is not intended to replace advice given to you by your health care provider. Make sure you discuss any questions you have with your health care provider. Document Released: 04/24/2005 Document Revised: 12/19/2015 Document Reviewed: 02/17/2015 Elsevier Interactive Patient Education  2018 [employer]. Hypertension Hypertension, commonly called high blood pressure, is when the force of blood pumping through the arteries is too strong. The arteries are the blood vessels that carry blood from the heart throughout the body. Hypertension forces the heart to work harder to pump blood and may cause arteries to become narrow or stiff. Having untreated or uncontrolled hypertension can cause heart attacks, strokes, kidney disease, and other problems. A blood pressure reading consists of a higher number over a lower number. Ideally, your blood pressure should be below 120/80. The first ("top") number is called the systolic pressure. It is a measure of the pressure in your arteries as your heart  beats. The second ("bottom") number is called the diastolic pressure. It is a measure of the pressure in your arteries as the heart relaxes. What are the causes? The cause of this condition is not known. What increases the risk? Some risk factors for high blood pressure are under your control. Others are not. Factors you can change  Smoking.  Having type 2 diabetes mellitus, high cholesterol, or both.  Not getting enough exercise or physical activity.  Being overweight.  Having too much fat, sugar, calories, or salt (sodium) in your diet.  Drinking too much alcohol. Factors that are difficult or impossible to change  Having chronic kidney disease.  Having a family history of high blood pressure.  Age. Risk increases with age.  Race. You may be at higher risk if you are  African-American.  Gender. Men are at higher risk than women before age 33. After age 47, women are at higher risk than men.  Having obstructive sleep apnea.  Stress. What are the signs or symptoms? Extremely high blood pressure (hypertensive crisis) may cause:  Headache.  Anxiety.  Shortness of breath.  Nosebleed.  Nausea and vomiting.  Severe chest pain.  Jerky movements you cannot control (seizures).  How is this diagnosed? This condition is diagnosed by measuring your blood pressure while you are seated, with your arm resting on a surface. The cuff of the blood pressure monitor will be placed directly against the skin of your upper arm at the level of your heart. It should be measured at least twice using the same arm. Certain conditions can cause a difference in blood pressure between your right and left arms. Certain factors can cause blood pressure readings to be lower or higher than normal (elevated) for a short period of time:  When your blood pressure is higher when you are in a health care provider's office than when you are at home, this is called white coat hypertension. Most people with this condition do not need medicines.  When your blood pressure is higher at home than when you are in a health care provider's office, this is called masked hypertension. Most people with this condition may need medicines to control blood pressure.  If you have a high blood pressure reading during one visit or you have normal blood pressure with other risk factors:  You may be asked to return on a different day to have your blood pressure checked again.  You may be asked to monitor your blood pressure at home for 1 week or longer.  If you are diagnosed with hypertension, you may have other blood or imaging tests to help your health care provider understand your overall risk for other conditions. How is this treated? This condition is treated by making healthy lifestyle changes,  such as eating healthy foods, exercising more, and reducing your alcohol intake. Your health care provider may prescribe medicine if lifestyle changes are not enough to get your blood pressure under control, and if:  Your systolic blood pressure is above 130.  Your diastolic blood pressure is above 80.  Your personal target blood pressure may vary depending on your medical conditions, your age, and other factors. Follow these instructions at home: Eating and drinking  Eat a diet that is high in fiber and potassium, and low in sodium, added sugar, and fat. An example eating plan is called the DASH (Dietary Approaches to Stop Hypertension) diet. To eat this way: ? Eat plenty of fresh fruits and vegetables. Try to fill  half of your plate at each meal with fruits and vegetables. ? Eat whole grains, such as whole wheat pasta, brown rice, or whole grain bread. Fill about one quarter of your plate with whole grains. ? Eat or drink low-fat dairy products, such as skim milk or low-fat yogurt. ? Avoid fatty cuts of meat, processed or cured meats, and poultry with skin. Fill about one quarter of your plate with lean proteins, such as fish, chicken without skin, beans, eggs, and tofu. ? Avoid premade and processed foods. These tend to be higher in sodium, added sugar, and fat.  Reduce your daily sodium intake. Most people with hypertension should eat less than 1,500 mg of sodium a day.  Limit alcohol intake to no more than 1 drink a day for nonpregnant women and 2 drinks a day for men. One drink equals 12 oz of beer, 5 oz of wine, or 1 oz of hard liquor. Lifestyle  Work with your health care provider to maintain a healthy body weight or to lose weight. Ask what an ideal weight is for you.  Get at least 30 minutes of exercise that causes your heart to beat faster (aerobic exercise) most days of the week. Activities may include walking, swimming, or biking.  Include exercise to strengthen your muscles  (resistance exercise), such as pilates or lifting weights, as part of your weekly exercise routine. Try to do these types of exercises for 30 minutes at least 3 days a week.  Do not use any products that contain nicotine or tobacco, such as cigarettes and e-cigarettes. If you need help quitting, ask your health care provider.  Monitor your blood pressure at home as told by your health care provider.  Keep all follow-up visits as told by your health care provider. This is important. Medicines  Take over-the-counter and prescription medicines only as told by your health care provider. Follow directions carefully. Blood pressure medicines must be taken as prescribed.  Do not skip doses of blood pressure medicine. Doing this puts you at risk for problems and can make the medicine less effective.  Ask your health care provider about side effects or reactions to medicines that you should watch for. Contact a health care provider if:  You think you are having a reaction to a medicine you are taking.  You have headaches that keep coming back (recurring).  You feel dizzy.  You have swelling in your ankles.  You have trouble with your vision. Get help right away if:  You develop a severe headache or confusion.  You have unusual weakness or numbness.  You feel faint.  You have severe pain in your chest or abdomen.  You vomit repeatedly.  You have trouble breathing. Summary  Hypertension is when the force of blood pumping through your arteries is too strong. If this condition is not controlled, it may put you at risk for serious complications.  Your personal target blood pressure may vary depending on your medical conditions, your age, and other factors. For most people, a normal blood pressure is less than 120/80.  Hypertension is treated with lifestyle changes, medicines, or a combination of both. Lifestyle changes include weight loss, eating a healthy, low-sodium diet, exercising  more, and limiting alcohol. This information is not intended to replace advice given to you by your health care provider. Make sure you discuss any questions you have with your health care provider. Document Released: 04/24/2005 Document Revised: 03/22/2016 Document Reviewed: 03/22/2016 Elsevier Interactive Patient Education  2018  [employer] Sinus Rinse What is a sinus rinse? A sinus rinse is a simple home treatment that is used to rinse your sinuses with a sterile mixture of salt and water (saline solution). Sinuses are air-filled spaces in your skull behind the bones of your face and forehead that open into your nasal cavity. You will use the following:  Saline solution.  Neti pot or spray bottle. This releases the saline solution into your nose and through your sinuses. Neti pots and spray bottles can be purchased at [employer], a health food store, or online.  When would I do a sinus rinse? A sinus rinse can help to clear mucus, dirt, dust, or pollen from the nasal cavity. You may do a sinus rinse when you have a cold, a virus, nasal allergy symptoms, a sinus infection, or stuffiness in the nose or sinuses. If you are considering a sinus rinse:  Ask your child's health care provider before performing a sinus rinse on your child.  Do not do a sinus rinse if you have had ear or nasal surgery, ear infection, or blocked ears.  How do I do a sinus rinse?  Wash your hands.  Disinfect your device according to the directions provided and then dry it.  Use the solution that comes with your device or one that is sold separately in stores. Follow the mixing directions on the package.  Fill your device with the amount of saline solution as directed by the device instructions.  Stand over a sink and tilt your head sideways over the sink.  Place the spout of the device in your upper nostril (the one closer to the ceiling).  Gently pour or squeeze the saline solution into the  nasal cavity. The liquid should drain to the lower nostril if you are not overly congested.  Gently blow your nose. Blowing too hard may cause ear pain.  Repeat in the other nostril.  Clean and rinse your device with clean water and then air-dry it. Are there risks of a sinus rinse? Sinus rinse is generally very safe and effective. However, there are a few risks, which include:  A burning sensation in the sinuses. This may happen if you do not make the saline solution as directed. Make sure to follow all directions when making the saline solution.  Infection from contaminated water. This is rare, but possible.  Nasal irritation.  This information is not intended to replace advice given to you by your health care provider. Make sure you discuss any questions you have with your health care provider. Document Released: 11/19/2013 Document Revised: 03/21/2016 Document Reviewed: 09/09/2013 Elsevier Interactive Patient Education  2017 [employer]  Otitis Media With Effusion, Pediatric Otitis media with effusion (OME) occurs when there is inflammation of the middle ear and fluid in the middle ear space. There are no signs and symptoms of infection. The middle ear space contains air and the bones for hearing. Air in the middle ear space helps to transmit sound to the brain. OME is a common condition in children, and it often occurs after an ear infection. This condition may be present for several weeks or longer after an ear infection. Most cases of this condition get better on their own. What are the causes? OME is caused by a blockage of the eustachian tube in one or both ears. These tubes drain fluid in the ears to the back of the nose (nasopharynx). If the tissue in the tube swells up (edema), the tube  closes. This prevents fluid from draining. Blockage can be caused by:  Ear infections.  Colds and other upper respiratory infections.  Allergies.  Irritants, such as tobacco  smoke.  Enlarged adenoids. The adenoids are areas of soft tissue located high in the back of the throat, behind the nose and the roof of the mouth. They are part of the body's natural defense (immune) system.  A mass in the nasopharynx.  Damage to the ear caused by pressure changes (barotrauma).  What increases the risk? Your child is more likely to develop this condition if:  He or she has repeated ear and sinus infections.  He or she has allergies.  He or she is exposed to tobacco smoke.  He or she attends daycare.  He or she is not breastfed.  What are the signs or symptoms? Symptoms of this condition may not be obvious. Sometimes this condition does not have any symptoms, or symptoms may overlap with those of a cold or upper respiratory tract illness. Symptoms of this condition include:  Temporary hearing loss.  A feeling of fullness in the ear without pain.  Irritability or agitation.  Balance (vestibular) problems.  As a result of hearing loss, your child may:  Listen to the TV at a loud volume.  Not respond to questions.  Ask "What?" often when spoken to.  Mistake or confuse one sound or word for another.  Perform poorly at school.  Have a poor attention span.  Become agitated or irritated easily.  How is this diagnosed? This condition is diagnosed with an ear exam. Your child's health care provider will look inside your child's ear with an instrument (otoscope) to check for redness, swelling, and fluid. Other tests may be done, including:  A test to check the movement of the eardrum (pneumatic otoscopy). This is done by squeezing a small amount of air into the ear.  A test that changes air pressure in the middle ear to check how well the eardrum moves and to see if the eustachian tube is working (tympanogram).  Hearing test (audiogram). This test involves playing tones at different pitches to see if your child can hear each tone.  How is this  treated? Treatment for this condition depends on the cause. In many cases, the fluid goes away on its own. In some cases, your child may need a procedure to create a hole in the eardrum to allow fluid to drain (myringotomy) and to insert small drainage tubes (tympanostomy tubes) into the eardrums. These tubes help to drain fluid and prevent infection. This procedure may be recommended if:  OME does not get better over several months.  Your child has many ear infections within several months.  Your child has noticeable hearing loss.  Your child has problems with speech and language development.  Surgery may also be done to remove the adenoids (adenoidectomy). Follow these instructions at home:  Give over-the-counter and prescription medicines only as told by your child's health care provider.  Keep children away from any tobacco smoke.  Keep all follow-up visits as told by your child's health care provider. This is important. How is this prevented?  Keep your child's vaccinations up to date. Make sure your child gets all recommended vaccinations, including a pneumonia and flu vaccine.  Encourage hand washing. Your child should wash his or her hands often with soap and water. If there is no soap and water, he or she should use hand sanitizer.  Avoid exposing your child to tobacco  smoke.  Breastfeed your baby, if possible. Babies who are breastfed as long as possible are less likely to develop this condition. Contact a health care provider if:  Your child's hearing does not get better after 3 months.  Your child's hearing is worse.  Your child has ear pain.  Your child has a fever.  Your child has drainage from the ear.  Your child is dizzy.  Your child has a lump on his or her neck. Get help right away if:  Your child has bleeding from the nose.  Your child cannot move part of her or his face.  Your child has trouble breathing.  Your child cannot smell.  Your child  develops severe congestion.  Your child develops weakness.  Your child who is younger than 3 months has a temperature of 100F (38C) or higher. Summary  Otitis media with effusion (OME) occurs when there is inflammation of the middle ear and fluid in the middle ear space.  This condition is caused by blockage of one or both eustachian tubes, which drain fluid in the ears to the back of the nose.  Symptoms of this condition can include temporary hearing loss, a feeling of fullness in the ear, irritability or agitation, and balance (vestibular) problems. Sometimes, there are no symptoms.  This condition is diagnosed with an ear exam and tests, such as pneumatic otoscopy, tympanogram, and audiogram.  Treatment for this condition depends on the cause. In many cases, the fluid goes away on its own. This information is not intended to replace advice given to you by your health care provider. Make sure you discuss any questions you have with your health care provider. Document Released: 07/15/2003 Document Revised: 03/16/2016 Document Reviewed: 03/16/2016 Elsevier Interactive Patient Education  2017 [employer] Vertigo Vertigo is the feeling that you or your surroundings are moving when they are not. Vertigo can be dangerous if it occurs while you are doing something that could endanger you or others, such as driving. What are the causes? This condition is caused by a disturbance in the signals that are sent by your body's sensory systems to your brain. Different causes of a disturbance can lead to vertigo, including:  Infections, especially in the inner ear.  A bad reaction to a drug, or misuse of alcohol and medicines.  Withdrawal from drugs or alcohol.  Quickly changing positions, as when lying down or rolling over in bed.  Migraine headaches.  Decreased blood flow to the brain.  Decreased blood pressure.  Increased pressure in the brain from a head or neck injury, stroke,  infection, tumor, or bleeding.  Central nervous system disorders.  What are the signs or symptoms? Symptoms of this condition usually occur when you move your head or your eyes in different directions. Symptoms may start suddenly, and they usually last for less than a minute. Symptoms may include:  Loss of balance and falling.  Feeling like you are spinning or moving.  Feeling like your surroundings are spinning or moving.  Nausea and vomiting.  Blurred vision or double vision.  Difficulty hearing.  Slurred speech.  Dizziness.  Involuntary eye movement (nystagmus).  Symptoms can be mild and cause only slight annoyance, or they can be severe and interfere with daily life. Episodes of vertigo may return (recur) over time, and they are often triggered by certain movements. Symptoms may improve over time. How is this diagnosed? This condition may be diagnosed based on medical history and the quality of your nystagmus. Your  health care provider may test your eye movements by asking you to quickly change positions to trigger the nystagmus. This may be called the Dix-Hallpike test, head thrust test, or roll test. You may be referred to a health care provider who specializes in ear, nose, and throat (ENT) problems (otolaryngologist) or a provider who specializes in disorders of the central nervous system (neurologist). You may have additional testing, including:  A physical exam.  Blood tests.  MRI.  A CT scan.  An electrocardiogram (ECG). This records electrical activity in your heart.  An electroencephalogram (EEG). This records electrical activity in your brain.  Hearing tests.  How is this treated? Treatment for this condition depends on the cause and the severity of the symptoms. Treatment options include:  Medicines to treat nausea or vertigo. These are usually used for severe cases. Some medicines that are used to treat other conditions may also reduce or eliminate  vertigo symptoms. These include: ? Medicines that control allergies (antihistamines). ? Medicines that control seizures (anticonvulsants). ? Medicines that relieve depression (antidepressants). ? Medicines that relieve anxiety (sedatives).  Head movements to adjust your inner ear back to normal. If your vertigo is caused by an ear problem, your health care provider may recommend certain movements to correct the problem.  Surgery. This is rare.  Follow these instructions at home: Safety  Move slowly.Avoid sudden body or head movements.  Avoid driving.  Avoid operating heavy machinery.  Avoid doing any tasks that would cause danger to you or others if you would have a vertigo episode during the task.  If you have trouble walking or keeping your balance, try using a cane for stability. If you feel dizzy or unstable, sit down right away.  Return to your normal activities as told by your health care provider. Ask your health care provider what activities are safe for you. General instructions  Take over-the-counter and prescription medicines only as told by your health care provider.  Avoid certain positions or movements as told by your health care provider.  Drink enough fluid to keep your urine clear or pale yellow.  Keep all follow-up visits as told by your health care provider. This is important. Contact a health care provider if:  Your medicines do not relieve your vertigo or they make it worse.  You have a fever.  Your condition gets worse or you develop new symptoms.  Your family or friends notice any behavioral changes.  Your nausea or vomiting gets worse.  You have numbness or a "pins and needles" sensation in part of your body. Get help right away if:  You have difficulty moving or speaking.  You are always dizzy.  You faint.  You develop severe headaches.  You have weakness in your hands, arms, or legs.  You have changes in your hearing or  vision.  You develop a stiff neck.  You develop sensitivity to light. This information is not intended to replace advice given to you by your health care provider. Make sure you discuss any questions you have with your health care provider. Document Released: 02/01/2005 Document Revised: 10/06/2015 Document Reviewed: 08/17/2014 Elsevier Interactive Patient Education  [employer].

## 2018-02-05 NOTE — Progress Notes (Signed)
Subjective:    Patient ID: John [name], male    DOB: July 17, 1970, 47 y.o.   MRN: [id number]  47y/o caucasian male married established patient here for re-evaluation due to swelling left cheek.  Pain and pressure resolved but now swollen.  Has lost 2-3 lbs working on diet and exercise cutting out white bread, watching intake/diet for high blood pressure which is improved today from last week 01/31/18.  Stopped nasal saline after starting flonase.  Mucous has become thicker increased nasal congestion and now green not clear/yellow.  Sore throat also improved. Headache resolved had some dizzyness with head/position changes last week thought it was his blood pressure but still occurs when his ears pop + sick contacts at work and home (daughter)     Review of Systems  Constitutional: Negative for activity change, appetite change, chills, diaphoresis, fatigue, fever and unexpected weight change.  HENT: Positive for congestion, facial swelling, postnasal drip, rhinorrhea and sore throat. Negative for dental problem, drooling, ear discharge, ear pain, hearing loss, mouth sores, nosebleeds, sinus pressure, sinus pain, sneezing, tinnitus, trouble swallowing and voice change.   Eyes: Negative for photophobia, pain, discharge, redness, itching and visual disturbance.  Respiratory: Negative for cough, choking, chest tightness, shortness of breath, wheezing and stridor.   Cardiovascular: Negative for chest pain, palpitations and leg swelling.  Gastrointestinal: Negative for abdominal distention, abdominal pain, blood in stool, constipation, diarrhea, nausea and vomiting.  Endocrine: Negative for cold intolerance and heat intolerance.  Genitourinary: Negative for dysuria.  Musculoskeletal: Negative for arthralgias, back pain, gait problem, joint swelling, myalgias, neck pain and neck stiffness.  Skin: Negative for color change, pallor, rash and wound.  Allergic/Immunologic: Positive for environmental  allergies. Negative for food allergies and immunocompromised state.  Neurological: Positive for dizziness and headaches. Negative for tremors, seizures, syncope, facial asymmetry, speech difficulty, weakness, light-headedness and numbness.  Hematological: Negative for adenopathy. Does not bruise/bleed easily.  Psychiatric/Behavioral: Negative for agitation, behavioral problems, confusion and sleep disturbance.       Objective:   Physical Exam  Constitutional: He is oriented to person, place, and time. Vital signs are normal. He appears well-developed and well-nourished. He is active and cooperative.  Non-toxic appearance. He does not have a sickly appearance. He appears ill. No distress.  HENT:  Head: Normocephalic and atraumatic.    Right Ear: External ear and ear canal normal. A middle ear effusion is present.  Left Ear: Hearing, external ear and ear canal normal. A middle ear effusion is present.  Nose: Mucosal edema and rhinorrhea present. No nose lacerations, sinus tenderness, nasal deformity, septal deviation or nasal septal hematoma. No epistaxis.  No foreign bodies. Right sinus exhibits no maxillary sinus tenderness and no frontal sinus tenderness. Left sinus exhibits no maxillary sinus tenderness and no frontal sinus tenderness.  Mouth/Throat: Uvula is midline and mucous membranes are normal. Mucous membranes are not pale, not dry and not cyanotic. He does not have dentures. No oral lesions. No trismus in the jaw. Normal dentition. No dental abscesses, uvula swelling, lacerations or dental caries. Posterior oropharyngeal edema and posterior oropharyngeal erythema present. No oropharyngeal exudate or tonsillar abscesses.  Cobblestoning posterior pharynx; bilateral TMs air fluid level clear; bilateral allergic shiners; frequent nasal clearing; clear discharge bilateral nasal turbinates edema/erythema  Eyes: Pupils are equal, round, and reactive to light. Conjunctivae, EOM and lids are  normal. Right eye exhibits no chemosis, no discharge, no exudate and no hordeolum. No foreign body present in the right eye. Left eye exhibits no chemosis,  no discharge, no exudate and no hordeolum. No foreign body present in the left eye. Right conjunctiva is not injected. Right conjunctiva has no hemorrhage. Left conjunctiva is not injected. Left conjunctiva has no hemorrhage. No scleral icterus. Right eye exhibits normal extraocular motion and no nystagmus. Left eye exhibits normal extraocular motion and no nystagmus. Right pupil is round and reactive. Left pupil is round and reactive. Pupils are equal.  Neck: Trachea normal, normal range of motion and phonation normal. Neck supple. No tracheal tenderness, no spinous process tenderness and no muscular tenderness present. No neck rigidity. No tracheal deviation, no edema, no erythema and normal range of motion present. No thyroid mass and no thyromegaly present.  Cardiovascular: Normal rate, regular rhythm, S1 normal, S2 normal, normal heart sounds and intact distal pulses. PMI is not displaced. Exam reveals no gallop and no friction rub.  No murmur heard. Pulses:      Radial pulses are 2+ on the right side, and 2+ on the left side.  No pedal edema  Pulmonary/Chest: Effort normal and breath sounds normal. No stridor. No respiratory distress. He has no decreased breath sounds. He has no wheezes. He has no rhonchi. He has no rales.  No cough observed in exam room; spoke full sentences without difficulty  Abdominal: Soft. Normal appearance. He exhibits no distension, no fluid wave and no ascites. There is no rigidity and no guarding.  Musculoskeletal: Normal range of motion. He exhibits no edema or tenderness.       Right shoulder: Normal.       Left shoulder: Normal.       Right elbow: Normal.      Left elbow: Normal.       Right hip: Normal.       Left hip: Normal.       Right knee: Normal.       Left knee: Normal.       Cervical back: Normal.        Thoracic back: Normal.       Lumbar back: Normal.       Right hand: Normal.       Left hand: Normal.  Lymphadenopathy:       Head (right side): No submental, no submandibular, no tonsillar, no preauricular, no posterior auricular and no occipital adenopathy present.       Head (left side): No submental, no submandibular, no tonsillar, no preauricular, no posterior auricular and no occipital adenopathy present.    He has no cervical adenopathy.       Right cervical: No superficial cervical, no deep cervical and no posterior cervical adenopathy present.      Left cervical: No superficial cervical, no deep cervical and no posterior cervical adenopathy present.  Neurological: He is alert and oriented to person, place, and time. He is not disoriented. He displays no atrophy and no tremor. No cranial nerve deficit or sensory deficit. He exhibits normal muscle tone. He displays no seizure activity. Coordination and gait normal. GCS eye subscore is 4. GCS verbal subscore is 5. GCS motor subscore is 6.  On/off exam table without difficulty; gait sure and steady in hallway  Skin: Skin is warm, dry and intact. Capillary refill takes less than 2 seconds. No abrasion, no bruising, no burn, no ecchymosis, no laceration, no lesion, no petechiae and no rash noted. He is not diaphoretic. No cyanosis or erythema. No pallor. Nails show no clubbing.  Psychiatric: He has a normal mood and affect. His speech is normal  and behavior is normal. Judgment and thought content normal. He is not actively hallucinating. Cognition and memory are normal. He is attentive.  Nursing note and vitals reviewed.         Assessment & Plan:  A-maxillary sinusitis acute recurrent, otitis media effusion bilateral, BPPV, elevated blood pressure reading  P-Continue flonase 1 spray each nostril BID, restart saline 2 sprays each nostril q2h wa prn congestion and start augmentin 875mg  po BID x 10 days #20 RF0 dispensed from [facility] to  patient.  Last used amoxicillin Apr 2019.  Currently with maxillary swelling pressure with palpation right..  Denied personal or family history of ENT cancer. Tylenol 1000mg  po QID prn swelling/pain 4 UD given from clinic stock. Shower BID especially prior to bed. No evidence of systemic bacterial infection, non toxic and well hydrated.  I do not see where any further testing or imaging is necessary at this time.   I will suggest supportive care, rest, good hygiene and encourage the patient to take adequate fluids.  The patient is to return to clinic or EMERGENCY ROOM if symptoms worsen or change significantly.  Exitcare handout on sinusitis and sinus rinse printed and given to patient.  Patient verbalized agreement and understanding of treatment plan and had no further questions at this time.    Supportive treatment.   No evidence of invasive bacterial infection, non toxic and well hydrated.  This is most likely self limiting viral infection.  I do not see where any further testing or imaging is necessary at this time.   I will suggest supportive care, rest, good hygiene and encourage the patient to take adequate fluids.  The patient is to return to clinic or EMERGENCY ROOM if symptoms worsen or change significantly e.g. ear pain, fever, purulent discharge from ears or bleeding.  Exitcare handout on otitis media with effusion given to patient.  Patient verbalized agreement and understanding of treatment plan.    Discussed with patient otitis media with effusion probably causing vertigo but could also be age.Electronic Rx to his pharmacy of choice Meclizine 25mg  po QID prn vertigo #30 RF0. Supportive treatment may take up to 4 doses meclizine per day max 100mg  per 24 hours.   recommended not driving during vertigo episodes. Follow up if aphasia, dysphasia, visual changes, weakness, fall, worst headache of life, incoordination, fever, ear discharge. Consider ENT evaluation/follow up with PCM if worsening symptoms  not controlled with meclizine exitcare handout printed and given on vertigo. Patient verbalized understanding of information/agreed with plan of care and had no further questions at this time.  P2:  Hand washing and cover cough  Improved from previous.  Encouraged patient to continue watching his dietary choices, avoid caffeine, continue weight loss efforts and exercising on regular basis 150 minutes per week.Discussed tylenol instead of motrin/nsaids for pain as nsaids can raise BP.  Keep follow up with PCM and RN Rolly Salter for BP checks at [facility] clinic to [facility].  Follow up sooner if worst headache of life, visual changes, chest pain/shortness of breath.  Patient verbalized understanding information/instructions, agreed with plan of care and had no further questions at this time.

## 2018-02-15 ENCOUNTER — Ambulatory Visit: Payer: No Typology Code available for payment source | Admitting: Family Medicine

## 2018-02-15 VITALS — BP 120/80 | HR 65 | Temp 97.9°F | Wt 204.0 lb

## 2018-02-15 DIAGNOSIS — R030 Elevated blood-pressure reading, without diagnosis of hypertension: Secondary | ICD-10-CM | POA: Diagnosis not present

## 2018-02-15 NOTE — Progress Notes (Signed)
Subjective:  Patient ID: John [name], male    DOB: 1970-05-27  Age: 47 y.o. MRN: [id number]  CC: Hypertension   HPI John [name] presents for follow-up of his blood pressure.  He is taking multiple readings with his home wrist monitor.  By enlarge they have been running in the 122 upper 130 over 80s to low 90s range.  He is experiencing lightheadedness without spinning sensation.  He was concerned this could be due to his blood pressure.  He has been treated for recent sinusitis with Augmentin and Flonase.  He has changed his diet to incorporate less red meat and more fruits and vegetables.  He is watching the salt in his diet as well.  Outpatient Medications Prior to Visit  Medication Sig Dispense Refill  . fluticasone (FLONASE) 50 MCG/ACT nasal spray Place 1 spray into both nostrils 2 (two) times daily. 16 g 6  . loratadine (CLARITIN) 10 MG tablet Take 1 tablet (10 mg total) by mouth daily. 30 tablet 11  . sodium chloride (OCEAN) 0.65 % SOLN nasal spray Place 2 sprays into both nostrils every 2 (two) hours while awake.  0  . meclizine (ANTIVERT) 25 MG tablet Take 1 tablet (25 mg total) by mouth 4 (four) times daily as needed for up to 10 days for dizziness. (Patient not taking: Reported on 02/15/2018) 30 tablet 0  . amoxicillin-clavulanate (AUGMENTIN) 875-125 MG tablet Take 1 tablet by mouth 2 (two) times daily. 20 tablet 0   No facility-administered medications prior to visit.     ROS Review of Systems  Constitutional: Negative.   HENT: Negative.   Respiratory: Negative.   Cardiovascular: Negative.   Neurological: Positive for light-headedness.  Psychiatric/Behavioral: Negative.     Objective:  BP 120/80   Pulse 65   Temp 97.9 F (36.6 C)   Wt 204 lb (92.5 kg)   SpO2 97%   BMI 28.45 kg/m   BP Readings from Last 3 Encounters:  02/15/18 120/80  02/05/18 129/87  01/31/18 (!) 137/93    Wt Readings from Last 3 Encounters:  02/15/18 204 lb (92.5 kg)  01/25/18  206 lb 6 oz (93.6 kg)  12/29/16 195 lb (88.5 kg)    Physical Exam  Constitutional: He is oriented to person, place, and time. He appears well-developed and well-nourished. No distress.  HENT:  Head: Normocephalic and atraumatic.  Right Ear: External ear normal.  Left Ear: External ear normal.  Mouth/Throat: Oropharynx is clear and moist. No oropharyngeal exudate.  Eyes: Pupils are equal, round, and reactive to light. Conjunctivae and EOM are normal. Right eye exhibits no discharge. Left eye exhibits no discharge. No scleral icterus.  Neck: Neck supple. No JVD present. No tracheal deviation present. No thyromegaly present.  Cardiovascular: Normal rate, regular rhythm and normal heart sounds.  Pulmonary/Chest: Effort normal and breath sounds normal.  Lymphadenopathy:    He has no cervical adenopathy.  Neurological: He is alert and oriented to person, place, and time.  Skin: He is not diaphoretic.  Psychiatric: He has a normal mood and affect. His behavior is normal.    Lab Results  Component Value Date   WBC 4.3 01/25/2018   HGB 16.0 01/25/2018   HCT 49.2 01/25/2018   PLT 253 01/25/2018   GLUCOSE 75 01/25/2018   CHOL 239 (H) 01/25/2018   TRIG 92 01/25/2018   HDL 60 01/25/2018   LDLCALC 161 (H) 01/25/2018   ALT 36 01/25/2018   AST 23 01/25/2018   NA 142 01/25/2018  K 4.2 01/25/2018   CL 102 01/25/2018   CREATININE 1.12 01/25/2018   BUN 13 01/25/2018   TSH 3.010 01/25/2018   HGBA1C 5.5 01/25/2018    Dg Esophagus  Result Date: 06/10/2003 Clinical Data:  47 year old with hiatal hernia, reflux and heartburn. BARIUM ESOPHAGRAM Initial barium swallows demonstrate normal pharyngeal motion with swallowing. No evidence of laryngeal penetration or aspiration. No upper esophageal webs, strictures or diverticula.  The pyriform sinus and vallecular air spaces are normal. Esophageal motility was normal.  The patient does have a small hiatal hernia but gastroesophageal reflux was  demonstrated.  No intrinsic or extrinsic mass lesions and no mucosal abnormalities.  IMPRESSION Small sliding type hiatal hernia but no gastroesophageal reflux is demonstrated. Normal esophageal motility. No webs or strictures. Provider: Queen Slough  The 10-year ASCVD risk score Denman George DC Montez Hageman., et al., 2013) is: 2.4%   Values used to calculate the score:     Age: 23 years     Sex: Male     Is Non-Hispanic African American: No     Diabetic: No     Tobacco smoker: No     Systolic Blood Pressure: 120 mmHg     Is BP treated: No     HDL Cholesterol: 60 mg/dL     Total Cholesterol: 239 mg/dL Assessment & Plan:   John [name] was seen today for hypertension.  Diagnoses and all orders for this visit:  Elevated BP without diagnosis of hypertension   I have discontinued John [name]'s amoxicillin-clavulanate. I am also having him maintain his sodium chloride, fluticasone, loratadine, and meclizine.  No orders of the defined types were placed in this encounter.  Patient will continue follow the DASH diet.  He is planning on losing 10 to 15 pounds.  We will continue to check and record his blood pressures.  Follow-up in 6 months for recheck.  We did discuss his low ASCVD risk.  Follow-up: Return in about 6 months (around 08/17/2018), or continue to check and record bps.  Mliss Sax, MD

## 2018-02-25 ENCOUNTER — Ambulatory Visit: Payer: Self-pay | Admitting: *Deleted

## 2018-02-25 VITALS — BP 132/90

## 2018-02-25 DIAGNOSIS — R030 Elevated blood-pressure reading, without diagnosis of hypertension: Secondary | ICD-10-CM

## 2018-02-25 NOTE — Progress Notes (Signed)
Pt had pcp f/u a week ago for elevated BP. No new meds. Continue lifestyle changes with reduced sodium in diet, increased exercise, reduced stress. Planning to lose 10-15#.  Scheduled f/u in 6 months. Going to order an upper arm home BP monitor as wrist cuff gives very variable readings per pt. Would like to continue coming in for weekly BP checks until he gets new cuff. Will check cuff against manual pressure, then begin monthly checks thereafter until next f/u. Pt agreeable to plan. No further questions/concerns at this time.

## 2018-03-08 ENCOUNTER — Ambulatory Visit: Payer: Self-pay | Admitting: *Deleted

## 2018-03-08 VITALS — BP 151/96 | HR 47

## 2018-03-08 DIAGNOSIS — R030 Elevated blood-pressure reading, without diagnosis of hypertension: Secondary | ICD-10-CM
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
## 2018-03-15 ENCOUNTER — Ambulatory Visit: Payer: Self-pay | Admitting: *Deleted

## 2018-03-15 VITALS — BP 134/84

## 2018-03-15 DIAGNOSIS — R030 Elevated blood-pressure reading, without diagnosis of hypertension: Secondary | ICD-10-CM

## 2018-03-15 NOTE — Progress Notes (Signed)
Discussed vertigo type sx again. Still occurring. Intermittent with varying durations. Feels better as far as PND and nasal congestion. Using Flonase for past week and never stopped nasal saline. Discussed next options at his request depending on exam in a few weeks after Flonase has chance for full effect and if he trials Meclizine, can recheck with Inetta Fermo. If all sinus/ear issues cleared, possibly anxiety based as he thinks could be correlated and he could follow up with onsite EAP counselor Debbra Riding, CSW. It not cleared, would be dependent on Tina's exam and opinion, possible ENT referral if his preference.  Pt appreciative and has no further questions/concerns.

## 2018-03-22 ENCOUNTER — Ambulatory Visit: Payer: Self-pay | Admitting: *Deleted

## 2018-03-22 VITALS — BP 136/90 | HR 48

## 2018-03-22 DIAGNOSIS — R030 Elevated blood-pressure reading, without diagnosis of hypertension: Secondary | ICD-10-CM

## 2018-04-11 ENCOUNTER — Other Ambulatory Visit: Payer: Self-pay | Admitting: Otolaryngology

## 2018-04-11 DIAGNOSIS — H918X2 Other specified hearing loss, left ear: Secondary | ICD-10-CM

## 2018-04-11 DIAGNOSIS — IMO0001 Reserved for inherently not codable concepts without codable children: Secondary | ICD-10-CM

## 2018-04-11 DIAGNOSIS — H9312 Tinnitus, left ear: Secondary | ICD-10-CM

## 2018-04-11 DIAGNOSIS — R42 Dizziness and giddiness: Secondary | ICD-10-CM

## 2018-04-24 ENCOUNTER — Ambulatory Visit
Admission: RE | Admit: 2018-04-24 | Discharge: 2018-04-24 | Disposition: A | Discharge status: Home / Self Care | Payer: No Typology Code available for payment source | Source: Ambulatory Visit | Attending: Otolaryngology | Admitting: Otolaryngology

## 2018-04-24 DIAGNOSIS — H9312 Tinnitus, left ear: Secondary | ICD-10-CM

## 2018-04-24 DIAGNOSIS — H918X2 Other specified hearing loss, left ear: Secondary | ICD-10-CM

## 2018-04-24 DIAGNOSIS — R42 Dizziness and giddiness: Secondary | ICD-10-CM

## 2018-04-24 DIAGNOSIS — IMO0001 Reserved for inherently not codable concepts without codable children: Secondary | ICD-10-CM

## 2018-04-24 MED ORDER — GADOBENATE DIMEGLUMINE 529 MG/ML IV SOLN
19.0000 mL | Freq: Once | INTRAVENOUS | Status: AC | PRN
  Administered 2018-04-24: 19 mL via INTRAVENOUS

## 2018-05-04 DIAGNOSIS — J301 Allergic rhinitis due to pollen: Secondary | ICD-10-CM | POA: Insufficient documentation

## 2018-09-03 ENCOUNTER — Telehealth: Payer: Self-pay | Admitting: Family Medicine

## 2018-09-03 NOTE — Telephone Encounter (Signed)
I called and left message on patient voicemail to call office and schedule follow up appointment with Dr. Kremer.  °

## 2019-08-04 ENCOUNTER — Ambulatory Visit: Payer: Self-pay | Attending: Internal Medicine

## 2019-08-04 DIAGNOSIS — Z23 Encounter for immunization: Secondary | ICD-10-CM

## 2019-08-04 NOTE — Progress Notes (Signed)
   Covid-19 Vaccination Clinic  Name:  John [name]    MRN: [id number] DOB: 1970/05/27  08/04/2019    John [name] was observed post Covid-19 immunization for 15 minutes without incident. John [name] was provided with Vaccine Information Sheet and instruction to access the V-Safe system.     John [name] was instructed to call 911 with any severe reactions post vaccine: Marland Kitchen Difficulty breathing  . Swelling of face and throat  . A fast heartbeat  . A bad rash all over body  . Dizziness and weakness   Immunizations Administered    Name Date Dose VIS Date Route   Pfizer COVID-19 Vaccine 08/04/2019  2:03 PM 0.3 mL 04/18/2019 Intramuscular   Manufacturer: [employer], [employer]   Lot: [id number]   NDC: [address]-8

## 2019-08-14 ENCOUNTER — Telehealth: Payer: Self-pay | Admitting: Registered Nurse

## 2019-08-14 ENCOUNTER — Encounter: Payer: Self-pay | Admitting: Registered Nurse

## 2019-08-14 NOTE — Telephone Encounter (Signed)
Notified by HR Salley Slaughter patient spouse sent home with symptoms consistent of covid testing pending and patient instructed to work remotely from home until spouse test results available.  Spouse symptoms started 08/09/2019 thought to be allergies but not improving with usual plan of care for allergies or cold symptoms.  Testing scheduled for tomorrow [facility] at 1100 and expect test results in 48 hours after test at this time.  HR Tim notified.

## 2019-08-14 NOTE — Telephone Encounter (Signed)
Patient contacted via cell phone. Denied symptoms stated feeling well and working from home today per [employer].  Patient notified to continue home quarantine until spouse test results available this weekend.  If positive will need to complete 14 day quarantine and testing prior to return to work.  If symptoms start then 10 day quarantine from day 1 of symptoms.  Discussed to take spouse to ER if blue lips/face, difficulty breathing, confusion.  To keep separated/wear mask if in same room until her test results available.  Discussed currently symptoms of spouse consistent with sinus infection but need to rule out covid as symptoms overlap.  Patient verbalized understanding information/instructions, agreed with plan of care and had no further questions at this time.

## 2019-08-14 NOTE — Telephone Encounter (Signed)
Patient reported that he received first pfizer covid vaccine a week ago Monday and so did spouse  Second dose scheduled for the end of this month.

## 2019-08-26 NOTE — Telephone Encounter (Signed)
Spouse negative for covid testing and feeling better symptoms resolving.  Patient denied having any symptoms and returned to work onsite also.  Patient to notify us if symptoms develop in the future.

## 2019-08-27 ENCOUNTER — Ambulatory Visit: Payer: Self-pay | Attending: Internal Medicine

## 2019-08-27 DIAGNOSIS — Z23 Encounter for immunization: Secondary | ICD-10-CM

## 2019-08-27 NOTE — Progress Notes (Signed)
   Covid-19 Vaccination Clinic  Name:  John [name]    MRN: [id number] DOB: 08/28/1970  08/27/2019  [name] was observed post Covid-19 immunization for 15 minutes without incident. He was provided with Vaccine Information Sheet and instruction to access the V-Safe system.   [name] was instructed to call 911 with any severe reactions post vaccine: Marland Kitchen Difficulty breathing  . Swelling of face and throat  . A fast heartbeat  . A bad rash all over body  . Dizziness and weakness   Immunizations Administered    Name Date Dose VIS Date Route   Pfizer COVID-19 Vaccine 08/27/2019  1:50 PM 0.3 mL 07/02/2018 Intramuscular   Manufacturer: [employer], [employer]   Lot: [id number]   NDC: [address]-1

## 2020-02-02 ENCOUNTER — Telehealth: Payer: Self-pay | Admitting: *Deleted

## 2020-02-02 NOTE — Telephone Encounter (Signed)
Noted agree with plan of care 

## 2020-02-02 NOTE — Telephone Encounter (Signed)
RN notified by HR that pt and pt's spouse had called out of work for today citing cold sx that started Saturday 01/31/20. Their teenage daughter started with sx first. Pt started having runny nose and headache. By Sunday evening he was also having chills and mild fatigue. This morning, Monday, sx have mostly resolved except mild sinus pressure in forehead/HA and rhinorrhea. Pt had already scheduled testing for today. Completed at 1300 at [facility] in [place].   Last day on-site: 01/30/20 Day 1 of Sx: 01/31/20 Day 1 of quarantine: 02/01/20 Testing between sx days 3-5 Test scheduled 9/27 at 1300 at [facility] Day 10 quarantine: 02/10/20 RTW date: 02/11/20 **Subject to change to 7 day quarantine ending 10/2 if sx continue resolving and negative test.  Advised pt that NP may call on Wed when clinic is closed for test results. Or RN will call on Thurs 9/30 for f/u. Pt verbalizes understanding and denies further questions or concerns.

## 2020-02-04 NOTE — Telephone Encounter (Signed)
Telephone message left for patient checking in to see if questions or concerns may email me at [email or web address] or RN Haley and I in clinic tomorrow [phone] and RN Haley clinic Friday until noon and I will be calling over the weekend again. 

## 2020-02-05 NOTE — Telephone Encounter (Signed)
Noted no worsening of symptoms and other family members getting covid tested.

## 2020-02-05 NOTE — Telephone Encounter (Signed)
Notified by HR John [name] patient reported positive covid test results last night and that his spouse and children results still pending.  Patient reported to HR still having sinus/upper respiratory mild symptoms.  Will attempt to reach patient today via telephone again

## 2020-02-05 NOTE — Telephone Encounter (Signed)
Spoke with pt over phone. Pt's sx have improved. Rhinorrhea 50% improved. Sinus pressure and chills resolved. Has a pulse ox at home, 97-99%. Younger daughter's and spouse's test back positive today. Taking older daughter for testing today. Plan for follow up next week prior to return. Pt given clinic hours and contact info if needed prior to next week.

## 2020-02-07 NOTE — Telephone Encounter (Signed)
Telephone message left for patient checking in if questions/concerns/new symptoms.  Can email me at [email or web address] and will call him back or if preferred communication email communicate that way.

## 2020-02-09 NOTE — Telephone Encounter (Signed)
Spoke with pt over phone. He reports all sx resolved except PND causing infrequent dry cough, throat clearing. Noted once during 3 minute conversation. Reports oldest child's test came back positive as well. He is cleared to RTW on 10/6 as per original quarantine schedule however reports he likely won't return until Thursday due to childcare as daughter is one day behind them on quarantine and won't return to school until Thursday. Advised pt that RN will notify HR of clearance to return 10/6, and any additional time will be childcare related and for him to discuss further with his supervisor. He verbalizes understanding, agreement. No further questions/concerns.

## 2020-02-10 NOTE — Telephone Encounter (Signed)
Noted post nasal drip continues with occasional cough plan RTW 6 Oct  Denied f/c/n/v/d in previous 24 hours

## 2020-02-11 NOTE — Telephone Encounter (Signed)
Please verify if patient returned to work as expected 02/12/2020

## 2020-02-12 NOTE — Telephone Encounter (Signed)
Pt back on-site today. Reports feeling well. Only with mild post nasal drip and throat clearing. Family feeling well overall as well. Pt verbalizes appreciation of the phone calls and f/u while on quarantine. No questions or concerns currently.

## 2020-02-12 NOTE — Telephone Encounter (Signed)
Noted patient returned to work and feeling well.

## 2020-03-19 IMAGING — MR MR BRAIN/IAC WO/W
11 of 12 series · 42 of 48 positions shown · IV contrast (Multihance 19 ml)
Comparison: None.

CLINICAL DATA: Dizziness and tinnitus

EXAM:
MRI HEAD WITHOUT AND WITH CONTRAST
TECHNIQUE: Multiplanar, multiecho pulse sequences of the brain and surrounding
structures were obtained without and with intravenous contrast.
CONTRAST:  19mL MULTIHANCE GADOBENATE DIMEGLUMINE 529 MG/ML IV SOLN

[Series 5: T1 · sagittal · 4.0mm · 0.75mm/px · 4 of 34 slices shown (1 of 3)]
[im 1/34]
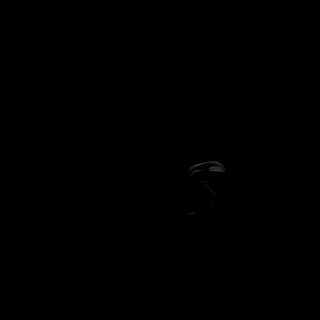
[im 12/34]
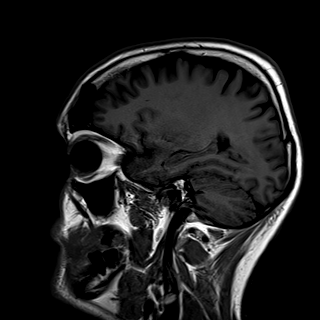
[im 23/34]
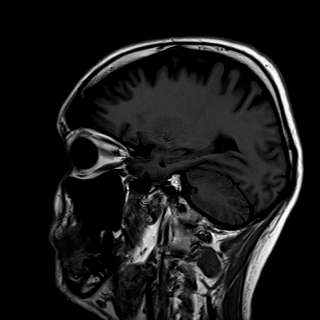
[im 34/34]
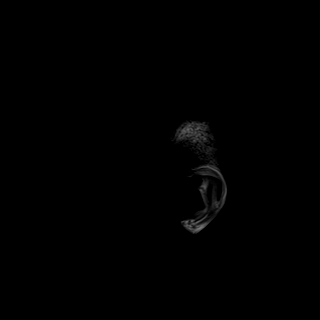

[Series 6: DWI · axial · 3.0mm · 1.44mm/px · z∈[-48,+106]mm · 7 of 82 slices shown (1 of 2)]
[im 1/82]
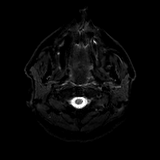
[im 14/82]
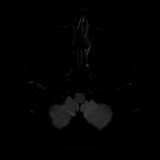
[im 28/82]
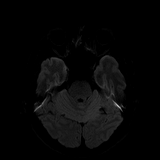
[im 41/82]
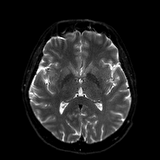
[im 55/82]
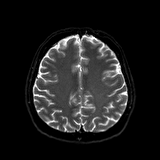
[im 68/82]
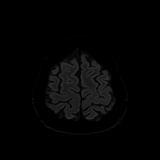
[im 82/82]
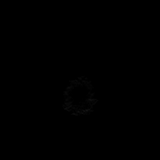

[Series 7: DWI · axial · 3.0mm · 1.44mm/px · z∈[-48,+106]mm · 3 of 41 slices shown (2 of 2)]
[im 1/41]
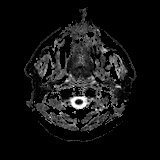
[im 21/41]
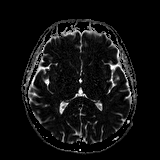
[im 41/41]
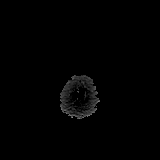

[Series 8: T2 · axial · 4.0mm · 0.36mm/px · z∈[-48,+106]mm · 2 of 31 slices shown]
[im 1/31]
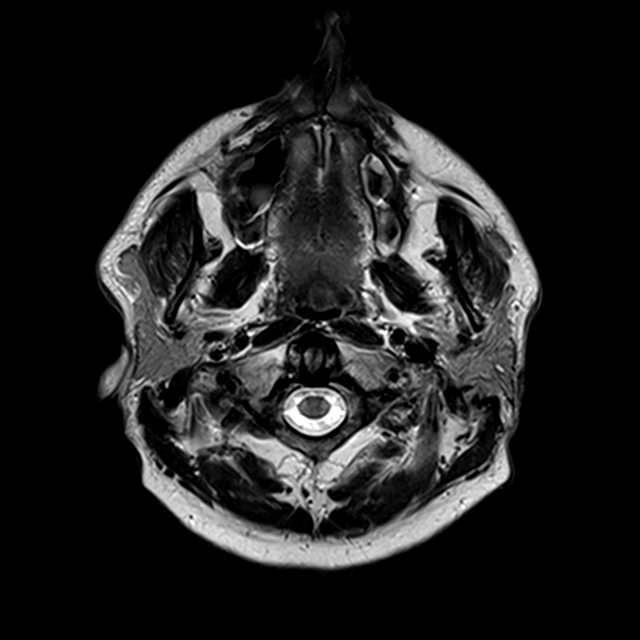
[im 31/31]
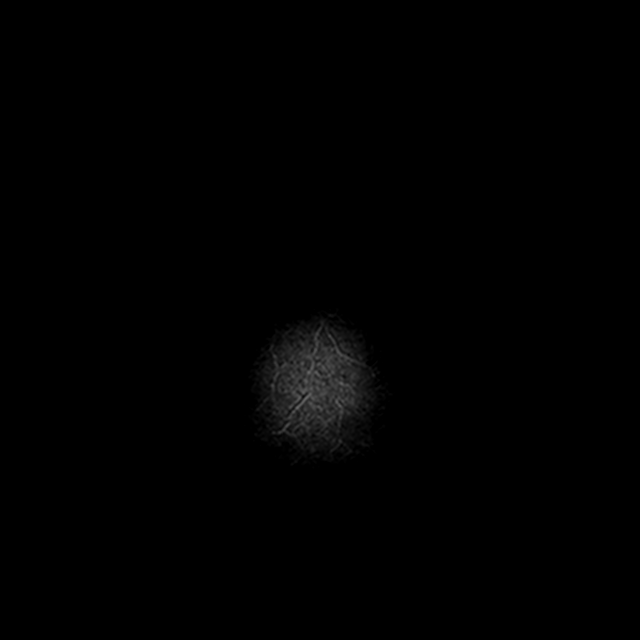

[Series 9: FLAIR · axial · 3.0mm · 0.72mm/px · z∈[-48,+106]mm · 2 of 27 slices shown]
[im 1/27]
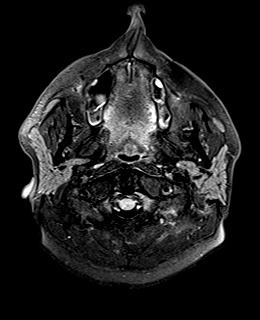
[im 27/27]
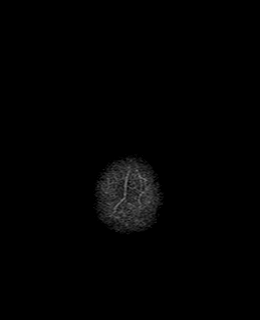

[Series 11: swi_images · axial · 1.5mm · 0.90mm/px · z∈[-47,+105]mm · 8 of 104 slices shown]
[im 1/104]
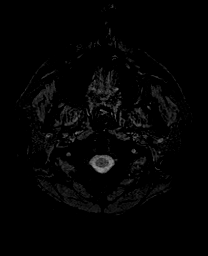
[im 15/104]
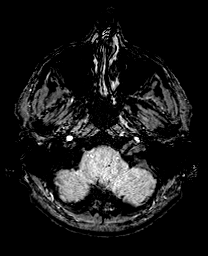
[im 30/104]
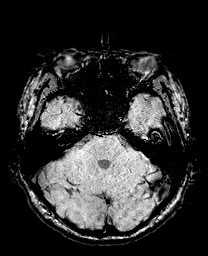
[im 45/104]
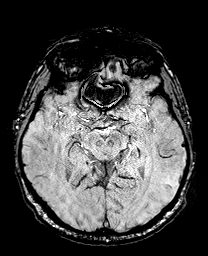
[im 59/104]
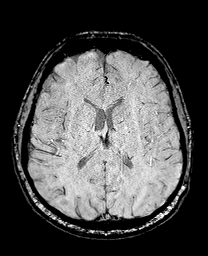
[im 74/104]
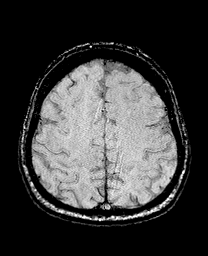
[im 89/104]
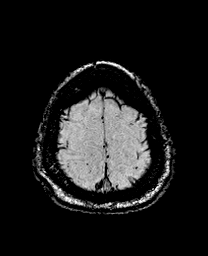
[im 104/104]
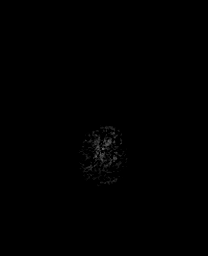

[Series 12: T1 · coronal · 2.5mm · 0.56mm/px · 1 of 16 slices shown (2 of 3)]
[im 1/16]
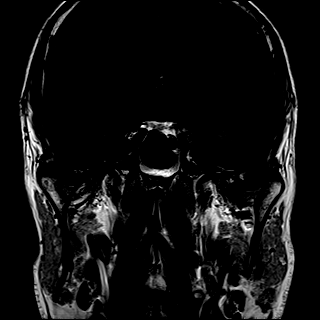

[Series 13: T1 · axial · 2.5mm · 0.50mm/px · 1 of 16 slices shown (3 of 3)]
[im 1/16]
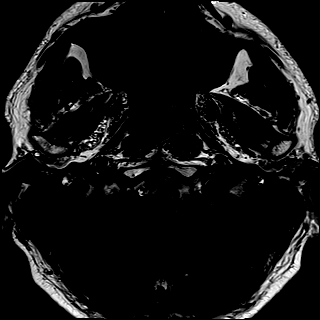

[Series 15: T1 post-contrast · coronal · 2.5mm · 0.56mm/px · 1 of 16 slices shown (1 of 3)]
[im 1/16]
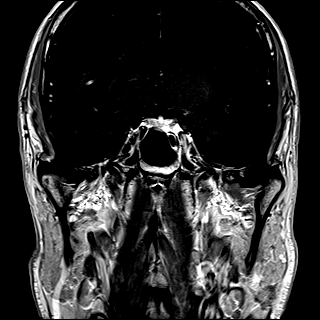

[Series 16: T1 post-contrast · axial · 2.5mm · 0.50mm/px · 1 of 16 slices shown (2 of 3)]
[im 1/16]
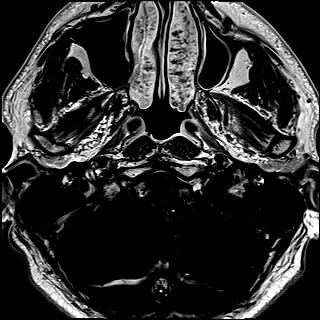

[Series 17: T1 post-contrast · axial · 1.0mm · 0.90mm/px · z∈[-50,+107]mm · 12 of 160 slices shown (3 of 3)]
[im 1/160]
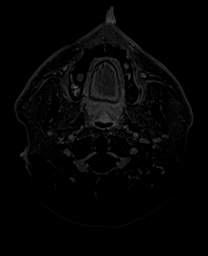
[im 15/160]
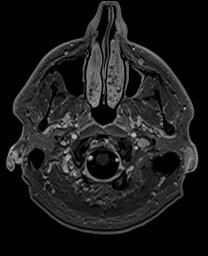
[im 29/160]
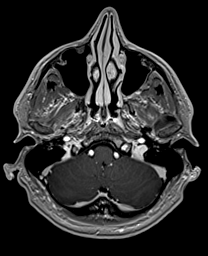
[im 44/160]
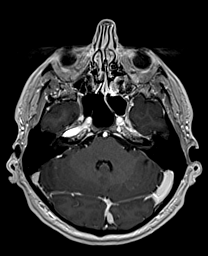
[im 58/160]
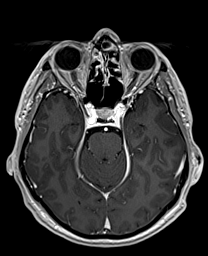
[im 73/160]
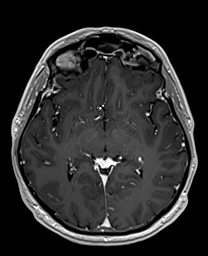
[im 87/160]
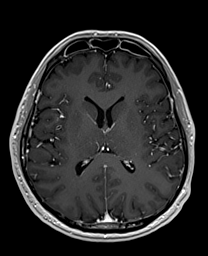
[im 102/160]
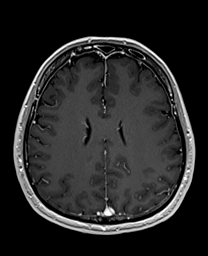
[im 116/160]
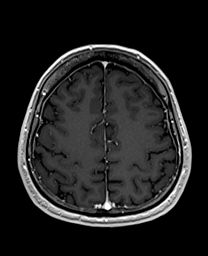
[im 131/160]
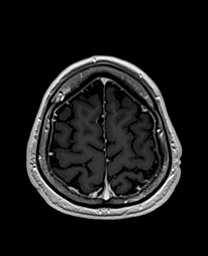
[im 145/160]
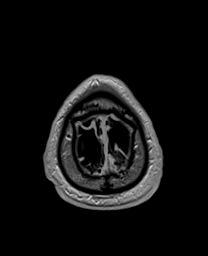
[im 160/160]
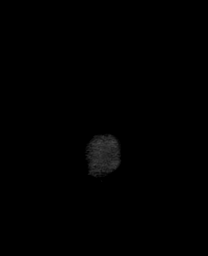

[42 of 48 positions shown; findings below may reference images not displayed]

FINDINGS: BRAIN: There is no acute infarct, acute hemorrhage, hydrocephalus or
extra-axial collection. The midline structures are normal. No
midline shift or other mass effect. There are no old infarcts. The
white matter signal is normal for the patient's age. The cerebral
and cerebellar volume are age-appropriate. Susceptibility-sensitive
sequences show no chronic microhemorrhage or superficial siderosis.

INTERNAL AUDITORY CANALS: There is no cerebellopontine angle mass.
The cochleae and semicircular canals are normal. No focal
abnormality along the course of the 7th and 8th cranial nerves.
Normal porus acusticus and vestibular aqueduct bilaterally.

VASCULAR: Major intracranial arterial and venous sinus flow voids
are normal.

SKULL AND UPPER CERVICAL SPINE: Calvarial bone marrow signal is
normal. There is no skull base mass. Visualized upper cervical spine
and soft tissues are normal.

SINUSES/ORBITS: No fluid levels or advanced mucosal thickening. No
mastoid or middle ear effusion. The orbits are normal.
IMPRESSION: Normal MRI of the brain, internal auditory canals and cranial nerves
[DATE] apparatus.

## 2020-05-04 ENCOUNTER — Other Ambulatory Visit: Payer: Self-pay

## 2020-05-04 ENCOUNTER — Encounter: Payer: Self-pay | Admitting: Registered Nurse

## 2020-05-04 ENCOUNTER — Ambulatory Visit: Payer: Self-pay | Admitting: Registered Nurse

## 2020-05-04 VITALS — BP 157/105 | HR 50 | Resp 16 | Ht 72.0 in | Wt 184.1 lb

## 2020-05-04 DIAGNOSIS — J301 Allergic rhinitis due to pollen: Secondary | ICD-10-CM

## 2020-05-04 DIAGNOSIS — H6993 Unspecified Eustachian tube disorder, bilateral: Secondary | ICD-10-CM

## 2020-05-04 DIAGNOSIS — H6983 Other specified disorders of Eustachian tube, bilateral: Secondary | ICD-10-CM

## 2020-05-04 DIAGNOSIS — M62838 Other muscle spasm: Secondary | ICD-10-CM

## 2020-05-04 DIAGNOSIS — R202 Paresthesia of skin: Secondary | ICD-10-CM

## 2020-05-04 DIAGNOSIS — M7711 Lateral epicondylitis, right elbow: Secondary | ICD-10-CM

## 2020-05-04 DIAGNOSIS — J3089 Other allergic rhinitis: Secondary | ICD-10-CM

## 2020-05-04 MED ORDER — FLUTICASONE PROPIONATE 50 MCG/ACT NA SUSP: 1.0000 | Freq: Two times a day (BID) | NASAL | 6 refills | Status: DC

## 2020-05-04 MED ORDER — LORATADINE 10 MG PO TABS: 10.0000 mg | ORAL_TABLET | Freq: Every day | ORAL | 3 refills | Status: DC

## 2020-05-04 MED ORDER — SALINE SPRAY 0.65 % NA SOLN: 2.0000 | NASAL | 0 refills | Status: DC

## 2020-05-04 NOTE — Progress Notes (Signed)
Subjective:    Patient ID: John [name], male    DOB: [birth year], 49 y.o.   MRN: [id number]  49y/o male married established caucasian patient here for evaluation of elbow pain and still having dizzyness symptoms since 2019 regarding feeling off balance/legs feeling heavy at times or sinking feeling when walking. Saw ENT Dec 2019 and audiology for tinnitus and diagnosed with hearing loss follow up prn 1-2 years.  MRI normal.  Unsure if something wrong in his back/legs causing leg symptoms or something else that hasn't been diagnosed yet.  Last labs 2019.  Hasn't seen PCM since covid pandemic started.  Had covid positive test/mild symptoms Sep 2021 Stopped all his allergy medication for the winter.  Denied vertigo/room spinning. 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  Playing soccer weekly 75 minutes and walking for exercise  No worsening just chronic now "sinking sensation as walking/heavy legs"  Denied vertigo/room spinning or problems with moving head and his balance/steadiness; his car 49 years old no new office furniture; back of leg/hamstring affected ?compression from sitting?  Back problems hasn't seen chiropractor but thinking about it; would like neurology evaluation; Does not want covid booster vaccine at this time due to positive covid test 02/04/2020;  Did not receive flu vaccination this year and does not want to receive it;  Patient reports some stress and anxiety regarding these symptoms and not having a definitive diagnosis     Review of Systems  Constitutional: Negative for activity change, appetite  change, chills, diaphoresis, fatigue and fever.  HENT: Negative for congestion, ear discharge, ear pain, facial swelling, hearing loss, nosebleeds, postnasal drip, rhinorrhea, sinus pressure, sinus pain, sneezing, sore throat, trouble swallowing and voice change.   Eyes: Negative for photophobia and visual disturbance.  Respiratory: Negative for cough, shortness of breath, wheezing and stridor.   Cardiovascular: Negative for chest pain, palpitations and leg swelling.  Gastrointestinal: Negative for abdominal pain, diarrhea, nausea and vomiting.  Endocrine: Negative for cold intolerance and heat intolerance.  Genitourinary: Negative for difficulty urinating.  Musculoskeletal: Positive for myalgias and neck pain. Negative for arthralgias, gait problem, joint swelling and neck stiffness.  Allergic/Immunologic: Positive for environmental allergies. Negative for food allergies.  Neurological: Positive for dizziness and numbness. Negative for tremors, seizures, syncope, facial asymmetry, speech difficulty, light-headedness and headaches.  Hematological: Negative for adenopathy. Does not bruise/bleed easily.  Psychiatric/Behavioral: Negative for agitation, confusion and sleep disturbance. The patient is nervous/anxious.        Objective:   Physical Exam Vitals and nursing note reviewed.  Constitutional:      General: He is awake and active. He is not in acute distress.Vital signs are normal.     Appearance: Normal appearance. He is well-developed, well-groomed, normal weight and well-nourished. He is not ill-appearing, toxic-appearing, sickly-appearing or diaphoretic.  HENT:     Head: Normocephalic and atraumatic.     Jaw: There is normal jaw occlusion. No trismus.     Salivary Glands: Right salivary gland is not diffusely enlarged or tender. Left salivary gland is not diffusely enlarged or tender.  Comments: Cobblestoning posterior pharynx; bilateral TMs air fluid level clear; bilateral  allergic shiners; scant nasal discharge bilateral nasal turinates    Right Ear: Hearing, ear canal and external ear normal. A middle ear effusion is present. There is no impacted cerumen.     Left Ear: Hearing, ear canal and external ear normal. A middle ear effusion is present. There is no impacted cerumen.     Nose: Mucosal edema and rhinorrhea present. No nasal deformity, septal deviation, signs of injury, laceration, nasal tenderness, sinus tenderness, congestion, nasal septal hematoma, epistaxis or foreign body. Rhinorrhea is clear.     Right Nostril: No epistaxis.     Left Nostril: No epistaxis.     Right Turbinates: Not enlarged, swollen or pale.     Left Turbinates: Not enlarged, swollen or pale.     Right Sinus: No maxillary sinus tenderness or frontal sinus tenderness.     Left Sinus: No maxillary sinus tenderness or frontal sinus tenderness.     Mouth/Throat:     Lips: Pink. No lesions.     Mouth: Mucous membranes are normal. Mucous membranes are moist. Mucous membranes are not pale, not dry and not cyanotic. No injury, lacerations, oral lesions or angioedema.     Dentition: No gingival swelling, dental caries, dental abscesses or gum lesions.     Tongue: No lesions. Tongue does not deviate from midline.     Palate: No mass and lesions.     Pharynx: Uvula midline. Pharyngeal swelling, posterior oropharyngeal edema and posterior oropharyngeal erythema present. No oropharyngeal exudate or uvula swelling.     Tonsils: No tonsillar exudate or tonsillar abscesses. 0 on the left.     Comments: Cobblestoning posterior pharynx; bilateral TMs air fluid level clear; bilateral allergic shiners; bilateral nasal turbinates without edema/erythema discharge clear scant Eyes:     General: Lids are normal. Vision grossly intact. Gaze aligned appropriately. Allergic shiner present. No visual field deficit or scleral icterus.       Right eye: No foreign body, discharge or hordeolum.        Left eye: No  foreign body, discharge or hordeolum.     Extraocular Movements: EOM normal.     Right eye: Normal extraocular motion and no nystagmus.     Left eye: Normal extraocular motion and no nystagmus.     Conjunctiva/sclera: Conjunctivae normal.     Right eye: Right conjunctiva is not injected. No chemosis, exudate or hemorrhage.    Left eye: Left conjunctiva is not injected. No chemosis, exudate or hemorrhage.    Pupils: Pupils are equal, round, and reactive to light. Pupils are equal.     Right eye: Pupil is round and reactive.     Left eye: Pupil is round and reactive.  Neck:     Thyroid: No thyroid mass or thyromegaly.     Vascular: No carotid bruit or JVD.     Trachea: Trachea and phonation normal. No tracheal tenderness or tracheal deviation.  Cardiovascular:     Rate and Rhythm: Regular rhythm. Bradycardia present.     Chest Wall: PMI is not displaced.     Pulses: Normal pulses and intact distal pulses.          Radial pulses are 2+ on the right side and 2+ on the left side.     Heart sounds: Normal heart sounds, S1 normal and S2 normal. No murmur heard. No friction rub. No gallop.   Pulmonary:     Effort: Pulmonary effort is normal.  No respiratory distress.     Breath sounds: Normal breath sounds and air entry. No stridor, decreased air movement or transmitted upper airway sounds. No decreased breath sounds, wheezing, rhonchi or rales.     Comments: Spoke full sentences without difficulty; no cough/nasal sniffing/throat clearing in exam room; wearing mask due to covid pandemic Abdominal:     General: Abdomen is flat. There is no distension.     Palpations: Abdomen is soft.  Musculoskeletal:        General: Tenderness present. No swelling, deformity, signs of injury or edema. Normal range of motion.     Right shoulder: Normal. No swelling, deformity, effusion, laceration, tenderness or crepitus. Normal range of motion. Normal strength.     Left shoulder: Normal. No swelling, deformity,  effusion, laceration, tenderness or crepitus. Normal range of motion. Normal strength.     Right upper arm: Normal. No swelling, edema, deformity or lacerations.     Left upper arm: Normal. No swelling, edema, deformity or lacerations.     Right elbow: No swelling, deformity, effusion or lacerations. Normal range of motion. Tenderness present in lateral epicondyle.     Left elbow: No swelling, deformity, effusion or lacerations. Normal range of motion. Tenderness present in lateral epicondyle.     Right forearm: No swelling, edema, deformity, lacerations or tenderness.     Left forearm: No swelling, edema, deformity, lacerations or tenderness.     Right hand: No swelling, deformity, lacerations or tenderness. Normal range of motion. Normal strength. Normal capillary refill.     Left hand: No swelling, deformity, lacerations or tenderness. Normal range of motion. Normal strength. Normal capillary refill.     Cervical back: Normal range of motion and neck supple. Spasms present. No swelling, edema, deformity, erythema, signs of trauma, lacerations, rigidity, torticollis, tenderness or crepitus. No pain with movement, spinous process tenderness or muscular tenderness. Normal range of motion.     Thoracic back: Spasms present. No swelling, edema, deformity, signs of trauma, lacerations, tenderness or bony tenderness. Normal range of motion. No scoliosis.     Lumbar back: Normal. No swelling, edema, deformity, signs of trauma, lacerations, spasms, tenderness or bony tenderness. Normal range of motion. No scoliosis.     Right lower leg: No edema.     Left lower leg: No edema.     Comments: Hamstrings tight able to touch mid shin with knees straight; bilateral trapezius and paraspinals cervical C6-7 T1-2 tight; right hip better AROM than left by 10 degrees abduction; equal flexion/extension bilaterally  Lymphadenopathy:     Head:     Right side of head: No submental, submandibular, tonsillar, preauricular,  posterior auricular or occipital adenopathy.     Left side of head: No submental, submandibular, tonsillar, preauricular, posterior auricular or occipital adenopathy.     Cervical: No cervical adenopathy.     Right cervical: No superficial, deep or posterior cervical adenopathy.    Left cervical: No superficial, deep or posterior cervical adenopathy.  Skin:    General: Skin is warm, dry and intact.     Capillary Refill: Capillary refill takes less than 2 seconds.     Coloration: Skin is not ashen, cyanotic, jaundiced, mottled, pale or sallow.     Findings: No abrasion, abscess, acne, bruising, burn, ecchymosis, erythema, signs of injury, laceration, lesion, petechiae, rash or wound.     Nails: There is no clubbing or cyanosis.  Neurological:     General: No focal deficit present.     Mental Status: He is  alert and oriented to person, place, and time. Mental status is at baseline.     GCS: GCS eye subscore is 4. GCS verbal subscore is 5. GCS motor subscore is 6.     Cranial Nerves: Cranial nerves are intact. No cranial nerve deficit, dysarthria or facial asymmetry.     Sensory: Sensation is intact. No sensory deficit.     Motor: Motor function is intact. No weakness, tremor, atrophy, abnormal muscle tone or seizure activity.     Coordination: Coordination is intact. Coordination normal.     Gait: Gait is intact. Gait normal.     Deep Tendon Reflexes: Reflexes normal.     Reflex Scores:      Brachioradialis reflexes are 2+ on the right side and 1+ on the left side.      Patellar reflexes are 2+ on the right side and 2+ on the left side.    Comments: Bilateral hand grasp 5/5; lower and upper extremity strength 5/5 equal bilaterally; on/off exam table and in/out of chair without difficulty; gait sure and steady in clinic; normal heel toe  Psychiatric:        Attention and Perception: Attention and perception normal.        Mood and Affect: Mood and affect, mood and affect normal.         Speech: Speech normal.        Behavior: Behavior normal. Behavior is cooperative.        Thought Content: Thought content normal.        Cognition and Memory: Cognition, memory and cognition and memory normal.        Judgment: Judgment normal.     Epic Review Audiology 04/09/2018"Mr. Whisonant is in for a hearing evaluation and Dix-Hallpike testing requested by Dr. Doran Heater. Chief complaint is intermittent dizziness. Dizziness is reportedly random and does not seem to be provoked by certain head positions or movement.   Otoscopy Canals were free of cerumen bilaterally  Audiological Evaluation  Audiogram indicates normal hearing across 250Hz - 2000Hz  with a mild high frequency hearing loss across 4000Hz -8000Hz  in the right ear. Left ear thresholds indicate normal hearing across 250Hz -2000Hz  with a mild to moderate sensorineraul hearing loss across 3000Hz -8000Hz . SRTs were 10dB in the right ear and 5dB in the left ear.   Speech discrimination Right ear: 96%, 60dB/40dB masking Left ear: 100%, 60dB/40dB masking  Transducer: phones  Test Relibility: goos  Tympanograms: DNT  Dix-Hallpike Evaluation  DVD: E6 Track: 12  Spontaneous nystagmus was not observed Dix-Hallpike was negative in head right position, negative in head left position Roll test was negative in head right/left position Nystagmus was absent in Supine position "  ENT note 04/09/2020 "Mr. Jaxson Anglin is a 49 y.o. male seen in consultation at the request of ,* for evaluation of sinus problems and dizziness. He reports 3 months ago- started with a few days of facial pain and pressure in September. Sinus infections have not been a recurrent problem with him. There was some headache when symptoms started. Treated with Augmentin after a few days, prescribed by a nurse at work. Began to feel dizzy, which made him anxious. Had slight elevation to his BP at PCP office, but no diagnosis of HTN. Was  taken off the antibiotics. There has been some fluctuation of BPs when monitored at home. He has been working on diet and lifestyle changes recently. Current symptoms include dizziness, which he describes as feeling uncomfortable, with a floating feeling. He denies vertigo. There  is mild associated nausea. Some left-sided tinnitus, which is non-pulsatile. Occasionally symptoms might be worse with left head-turning but he is unsure. Occasional post-nasal drip. Epistaxis with nose blowing (happened twice.) No nasal congestion. States there is occasional postnasal drip. Right ear "feels dry." Denies hearing loss, vision changes. No current facial pressure, changes in sense of smell.   Nasal spray usage: flonase.  Objective: Vitals:  04/09/18 1453  BP: 152/92  Pulse: 63  Resp: 16  Height: 1.803 m ( )  Weight: 94.3 kg (208 lb)  BMI (Calculated): 29.1   Physical Exam:  General Normocephalic, Awake, Alert and appropriate for the exam  Eyes PERRL, no scleral icterus or conjunctival hemorrhage.  EOMI.  Ears EAC patent, no obstructing cerumen Right TM: intact, no effusion, no retraction, normal landmarks Left TM: intact, no effusion, no retraction, normal landmarks Has had bilateral chondral setbacks.  Nose Patent, No polyps or masses seen. No obvious rhinorrhea.  Oral Pharynx No mucosal lesions or tumors seen. Dentition is grossly normal. Mirror laryngeal exam reveals crisp epiglottis, mobile arytenoids, normal BOT  Lymphatics No cervical lymphadenopathy or masses on palpation  Endocrine No thyroidmegaly, no thyroid masses palpated  Cardio-vascular No cyanosis, regular rate  Pulmonary No audible stridor, Breathing easily with no labor. No dysphonia.  Neuro Symmetric facial movement.  Tongue protrudes in midline.  Psychiatry Appropriate affect and mood for clinic visit.  Skin No scars or lesions on face or neck.    Audiogram today-normal hearing in the right ear with perhaps slight  presbycusis in the higher frequencies. Left ear is a similar pattern until approximately 3000 Hz where there is some mild sensorineural hearing loss in the high frequencies of the left ear. This is asymmetric. Gilberto Better testing will normal. There was no rotatory movement.  Assessment:  My impression is that [place] has  1. Asymmetrical hearing loss of left ear  2. Dizziness  3. Tinnitus of left ear  .    Plan:  1. MRI of the internal auditory canals to evaluate for asymmetric tinnitus and hearing loss 2. Conservative management of tinnitus discussed 3. We will call patient with results and follow-up. I suspect his dizziness to not be otologic in nature.  Electronically signed by: Misty Stanley, MD 04/09/2018 4:42 PM          Assessment & Plan:  A-paresthesias bilateral legs, seasonal allergic rhinitis, eustachian tube dysfunction bilateral, lateral epicondylitis right acute, muscle spasms neck/back initial visit  P-fasting labs exec panel, Vitamin B12/B6/Vitamin D/Zinc/Hgba1c.  Patient to review neurology network list of providers for [facility] and tell us his provider preference.  Discussed with patient elevated/low blood sugars, thyroid high/low, vitamin B6/12 high or low and/or low zinc can contribute to paresthesias   Compressed nerve in neck from muscle spasms, prolonged sitting sciatica, blood vessel disease (heavy leg feeling common symptom).  Consider compression knee highs to see if improved leg symptoms.  Patient wearing jeans today did not remove to look for varicose veins.  Discussed stretching while at work and if sitting to get up and take break, ergonomics at desk and monitoring posture while seated consider foot stool to decrease pressure on hamstring or adjust chair height.  Demonstrated hamstring stretches seated, on floor and standing.  Discussed foam roller stretches for chest/back/neck/legs and demonstrated in employee gym.  Discussed PT/chiropractic care could be  helpful for his symptoms.  Exitcare handouts printed and given on paresthesia, weakness, ataxia, sciatica, muscle cramps/spasms, cervical strain and rehab exercises.  Patient verbalized  understanding information/instructions, agreed with plan of care and had no further questions at this time.  Home stretches demonstrated to patient-e.g. Arm circles, walking up wall, chest stretches, neck AROM, chin tucks, knee to chest and rock side to side on back. Self massage or professional prn, foam roller use or tennis/racquetball to start.  Lacrosse ball for deep tissue self massage.  Consider biofreeze gel QID topical prn pain.  Heat/cryotherapy 15 minutes QID prn.  Patient did not want trial thermacare patch.  Did take hot/cold reusable pack for home use with cover from clinic stock.  Does not feel like he needs to take NSAIDS or tylenol at this time.  Consider physical therapy referral if no improvement with prescribed home therapy and/or chiropractic care.  Ensure ergonomics correct desk at work avoid repetitive motions if possible/holding phone/laptop in hand use desk/stand and/or break up lifting items into smaller loads/weights.  Patient was instructed to rest, ice, and ROM exercises.  Activity as tolerated.   Follow up if symptoms persist or worsen especially if loss of bowel/bladder control, arm/leg weakness and/or saddle paresthesias.  Exitcare handouts on muscle spasms and rehab exercises given to patient.  Patient verbalized agreement and understanding of treatment plan and had no further questions at this time.  P2:  Injury Prevention and Fitness.  Patient may use normal saline nasal spray 2 sprays each nostril q2h wa as needed. flonase 1 spray each nostril BID OTC.  Patient denied personal or family history of ENT cancer.  OTC antihistamine of choice claritin/zyrtec 10mg  po daily #90 RF3 (ENT recommended allegra 180mg  po daily).  Avoid triggers if possible.  Shower prior to bedtime if exposed to  triggers.  If allergic dust/dust mites recommend mattress/pillow covers/encasements; washing linens, vacuuming, sweeping, dusting weekly.  Call or return to clinic as needed if these symptoms worsen or fail to improve as anticipated.   Discussed with patient still having post nasal drip and bilateral otic effusion due to throat irritation/swelling.  Restart daily antihistamine and flonase/nasal saline 30 day trial to see if any improvement in his symptoms  Patient verbalized understanding of instructions, agreed with plan of care and had no further questions at this time.  P2:  Avoidance and hand washing.   No evidence of invasive bacterial infection, non toxic and well hydrated.  I do not see where any further testing or imaging is necessary at this time.   I will suggest supportive care, rest, good hygiene and encourage the patient to take adequate fluids.  The patient is to return to clinic or EMERGENCY ROOM if symptoms worsen or change significantly e.g. ear pain, fever, purulent discharge from ears or bleeding. Discussed with patient post nasal drip irritates throat/causes swelling blocks eustachian tubes from draining and fluid fills up middle ear.  Bacteria/viruses can grow in fluid and with moving head tube compressed and increases pressure in tube/ear worsening pain.  Studies show will take 30 days for fluid to resolve after post nasal drip controlled with nasal steroid/antihistamine. Antibiotics and steroids do not speed up fluid removal.  Patient verbalized agreement and understanding of treatment plan and had no further questions at this time.  Patient given Exitcare handout on elbow tendonitis lateral with rehab exercises. 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 Discussed rest, consider nsaid x 2 weeks Stretching 15 minutes TID bilaterally, Improved with 5 days rest over holiday.  Consider lateral epicondylitis strap if not continued improvement   Given  reusable ice pack  from clinic stock apply 15 minutes QID prn swelling/pain recommended but encouraged BID after work and evening every day. Follow up in 2 weeks re-evaluation if not improving consider PT/Orthpedics for steroid injection after strap trial.  Patient verbalized understanding of instructions, agreed with plan of care and had no further questions at this time.   Consider decrease caffeine intake.  ER if chest pain, worst headache of life, dyspnea or visual changes for re-evaluation.  Mild acute pain today due to lateral epicondylitis and neck/thoracic muscle spasms could also be elevating BP.  Continue 150 minutes exercise weekly and maintaining weight loss.  Avoid dehydration.

## 2020-05-04 NOTE — Patient Instructions (Addendum)
Hamstring Stretch    Lie supine with ball on one knee, other leg on top of ball. Bend lower leg to cause stretch in top leg. Hold _15-30__ seconds. Do _3__ sets of _3__ repetitions.  Copyright  VHI. All rights reserved.  Hamstring Stretch    With other leg bent, foot flat, grasp right leg and slowly try to straighten knee. Hold __15-30__ seconds. Repeat __3__ times. Do __3__ sessions per day.  http://[email or web address].exer.us/279   Copyright  VHI. All rights reserved.  Hamstring Stretch    Reach down along right leg until a comfortable stretch is felt in back of thigh. Be sure to keep knee straight. Hold __15-30__ seconds. Repeat _3___ times per set. Do _3___ sets per session. Do __3__ sessions per day.  http://orth.exer.us/156   Copyright  VHI. All rights reserved.  Sciatica  Sciatica is pain, numbness, weakness, or tingling along the path of the sciatic nerve. The sciatic nerve starts in the lower back and runs down the back of each leg. The nerve controls the muscles in the lower leg and in the back of the knee. It also provides feeling (sensation) to the back of the thigh, the lower leg, and the sole of the foot. Sciatica is a symptom of another medical condition that pinches or puts pressure on the sciatic nerve. Sciatica most often only affects one side of the body. Sciatica usually goes away on its own or with treatment. In some cases, sciatica may come back (recur). What are the causes? This condition is caused by pressure on the sciatic nerve or pinching of the nerve. This may be the result of:  A disk in between the bones of the spine bulging out too far (herniated disk).  Age-related changes in the spinal disks.  A pain disorder that affects a muscle in the buttock.  Extra bone growth near the sciatic nerve.  A break (fracture) of the pelvis.  Pregnancy.  Tumor. This is rare. What increases the risk? The following factors may make you more likely to develop this  condition:  Playing sports that place pressure or stress on the spine.  Having poor strength and flexibility.  A history of back injury or surgery.  Sitting for long periods of time.  Doing activities that involve repetitive bending or lifting.  Obesity. What are the signs or symptoms? Symptoms can vary from mild to very severe, and they may include:  Any of these problems in the lower back, leg, hip, or buttock: ? Mild tingling, numbness, or dull aches. ? Burning sensations. ? Sharp pains.  Numbness in the back of the calf or the sole of the foot.  Leg weakness.  Severe back pain that makes movement difficult. Symptoms may get worse when you cough, sneeze, or laugh, or when you sit or stand for long periods of time. How is this diagnosed? This condition may be diagnosed based on:  Your symptoms and medical history.  A physical exam.  Blood tests.  Imaging tests, such as: ? X-rays. ? MRI. ? CT scan. How is this treated? In many cases, this condition improves on its own without treatment. However, treatment may include:  Reducing or modifying physical activity.  Exercising and stretching.  Icing and applying heat to the affected area.  Medicines that help to: ? Relieve pain and swelling. ? Relax your muscles.  Injections of medicines that help to relieve pain, irritation, and inflammation around the sciatic nerve (steroids).  Surgery. Follow these instructions at home: Medicines  Take  over-the-counter and prescription medicines only as told by your health care provider.  Ask your health care provider if the medicine prescribed to you: ? Requires you to avoid driving or using heavy machinery. ? Can cause constipation. You may need to take these actions to prevent or treat constipation:  Drink enough fluid to keep your urine pale yellow.  Take over-the-counter or prescription medicines.  Eat foods that are high in fiber, such as beans, whole grains,  and fresh fruits and vegetables.  Limit foods that are high in fat and processed sugars, such as fried or sweet foods. Managing pain      If directed, put ice on the affected area. ? Put ice in a plastic bag. ? Place a towel between your skin and the bag. ? Leave the ice on for 20 minutes, 2-3 times a day.  If directed, apply heat to the affected area. Use the heat source that your health care provider recommends, such as a moist heat pack or a heating pad. ? Place a towel between your skin and the heat source. ? Leave the heat on for 20-30 minutes. ? Remove the heat if your skin turns bright red. This is especially important if you are unable to feel pain, heat, or cold. You may have a greater risk of getting burned. Activity   Return to your normal activities as told by your health care provider. Ask your health care provider what activities are safe for you.  Avoid activities that make your symptoms worse.  Take brief periods of rest throughout the day. ? When you rest for longer periods, mix in some mild activity or stretching between periods of rest. This will help to prevent stiffness and pain. ? Avoid sitting for long periods of time without moving. Get up and move around at least one time each hour.  Exercise and stretch regularly, as told by your health care provider.  Do not lift anything that is heavier than 10 lb (4.5 kg) while you have symptoms of sciatica. When you do not have symptoms, you should still avoid heavy lifting, especially repetitive heavy lifting.  When you lift objects, always use proper lifting technique, which includes: ? Bending your knees. ? Keeping the load close to your body. ? Avoiding twisting. General instructions  Maintain a healthy weight. Excess weight puts extra stress on your back.  Wear supportive, comfortable shoes. Avoid wearing high heels.  Avoid sleeping on a mattress that is too soft or too hard. A mattress that is firm enough  to support your back when you sleep may help to reduce your pain.  Keep all follow-up visits as told by your health care provider. This is important. Contact a health care provider if:  You have pain that: ? Wakes you up when you are sleeping. ? Gets worse when you lie down. ? Is worse than you have experienced in the past. ? Lasts longer than 4 weeks.  You have an unexplained weight loss. Get help right away if:  You are not able to control when you urinate or have bowel movements (incontinence).  You have: ? Weakness in your lower back, pelvis, buttocks, or legs that gets worse. ? Redness or swelling of your back. ? A burning sensation when you urinate. Summary  Sciatica is pain, numbness, weakness, or tingling along the path of the sciatic nerve.  This condition is caused by pressure on the sciatic nerve or pinching of the nerve.  Sciatica can cause pain, numbness,  or tingling in the lower back, legs, hips, and buttocks.  Treatment often includes rest, exercise, medicines, and applying ice or heat. This information is not intended to replace advice given to you by your health care provider. Make sure you discuss any questions you have with your health care provider. Document Revised: 05/13/2018 Document Reviewed: 05/13/2018 Elsevier Patient Education  2020 [employer] Sciatica Rehab Ask your health care provider which exercises are safe for you. Do exercises exactly as told by your health care provider and adjust them as directed. It is normal to feel mild stretching, pulling, tightness, or discomfort as you do these exercises. Stop right away if you feel sudden pain or your pain gets worse. Do not begin these exercises until told by your health care provider. Stretching and range-of-motion exercises These exercises warm up your muscles and joints and improve the movement and flexibility of your hips and back. These exercises also help to relieve pain, numbness, and  tingling. Sciatic nerve glide 1. Sit in a chair with your head facing down toward your chest. Place your hands behind your back. Let your shoulders slump forward. 2. Slowly straighten one of your legs while you tilt your head back as if you are looking toward the ceiling. Only straighten your leg as far as you can without making your symptoms worse. 3. Hold this position for ___15-30_______ seconds. 4. Slowly return to the starting position. 5. Repeat with your other leg. Repeat ____3______ times. Complete this exercise ______3____ times a day. Knee to chest with hip adduction and internal rotation  1. Lie on your back on a firm surface with both legs straight. 2. Bend one of your knees and move it up toward your chest until you feel a gentle stretch in your lower back and buttock. Then, move your knee toward the shoulder that is on the opposite side from your leg. This is hip adduction and internal rotation. ? Hold your leg in this position by holding on to the front of your knee. 3. Hold this position for ___15-30_______ seconds. 4. Slowly return to the starting position. 5. Repeat with your other leg. Repeat ____3______ times. Complete this exercise _____3_____ times a day. Prone extension on elbows  1. Lie on your abdomen on a firm surface. A bed may be too soft for this exercise. 2. Prop yourself up on your elbows. 3. Use your arms to help lift your chest up until you feel a gentle stretch in your abdomen and your lower back. ? This will place some of your body weight on your elbows. If this is uncomfortable, try stacking pillows under your chest. ? Your hips should stay down, against the surface that you are lying on. Keep your hip and back muscles relaxed. 4. Hold this position for ____15-30_____ seconds. 5. Slowly relax your upper body and return to the starting position. Repeat ____3______ times. Complete this exercise ____3______ times a day. Strengthening exercises These  exercises build strength and endurance in your back. Endurance is the ability to use your muscles for a long time, even after they get tired. Pelvic tilt This exercise strengthens the muscles that lie deep in the abdomen. 1. Lie on your back on a firm surface. Bend your knees and keep your feet flat on the floor. 2. Tense your abdominal muscles. Tip your pelvis up toward the ceiling and flatten your lower back into the floor. ? To help with this exercise, you may place a small towel under your lower back and  try to push your back into the towel. 3. Hold this position for _____15-30_____ seconds. 4. Let your muscles relax completely before you repeat this exercise. Repeat _____3_____ times. Complete this exercise ___3_______ times a day. Alternating arm and leg raises  1. Get on your hands and knees on a firm surface. If you are on a hard floor, you may want to use padding, such as an exercise mat, to cushion your knees. 2. Line up your arms and legs. Your hands should be directly below your shoulders, and your knees should be directly below your hips. 3. Lift your left leg behind you. At the same time, raise your right arm and straighten it in front of you. ? Do not lift your leg higher than your hip. ? Do not lift your arm higher than your shoulder. ? Keep your abdominal and back muscles tight. ? Keep your hips facing the ground. ? Do not arch your back. ? Keep your balance carefully, and do not hold your breath. 4. Hold this position for __15-30________ seconds. 5. Slowly return to the starting position. 6. Repeat with your right leg and your left arm. Repeat ____3______ times. Complete this exercise _____3_____ times a day. Posture and body mechanics Good posture and healthy body mechanics can help to relieve stress in your body's tissues and joints. Body mechanics refers to the movements and positions of your body while you do your daily activities. Posture is part of body mechanics.  Good posture means:  Your spine is in its natural S-curve position (neutral).  Your shoulders are pulled back slightly.  Your head is not tipped forward. Follow these guidelines to improve your posture and body mechanics in your everyday activities. Standing   When standing, keep your spine neutral and your feet about hip width apart. Keep a slight bend in your knees. Your ears, shoulders, and hips should line up.  When you do a task in which you stand in one place for a long time, place one foot up on a stable object that is 2-4 inches (5-10 cm) high, such as a footstool. This helps keep your spine neutral. Sitting   When sitting, keep your spine neutral and keep your feet flat on the floor. Use a footrest, if necessary, and keep your thighs parallel to the floor. Avoid rounding your shoulders, and avoid tilting your head forward.  When working at a desk or a computer, keep your desk at a height where your hands are slightly lower than your elbows. Slide your chair under your desk so you are close enough to maintain good posture.  When working at a computer, place your monitor at a height where you are looking straight ahead and you do not have to tilt your head forward or downward to look at the screen. Resting  When lying down and resting, avoid positions that are most painful for you.  If you have pain with activities such as sitting, bending, stooping, or squatting, lie in a position in which your body does not bend very much. For example, avoid curling up on your side with your arms and knees near your chest (fetal position).  If you have pain with activities such as standing for a long time or reaching with your arms, lie with your spine in a neutral position and bend your knees slightly. Try the following positions: ? Lying on your side with a pillow between your knees. ? Lying on your back with a pillow under your knees. Lifting  When lifting objects, keep your feet at  least shoulder width apart and tighten your abdominal muscles.  Bend your knees and hips and keep your spine neutral. It is important to lift using the strength of your legs, not your back. Do not lock your knees straight out.  Always ask for help to lift heavy or awkward objects. This information is not intended to replace advice given to you by your health care provider. Make sure you discuss any questions you have with your health care provider. Document Revised: 08/16/2018 Document Reviewed: 05/16/2018 Elsevier Patient Education  2020 [employer] Cervical Strain and Sprain Rehab Ask your health care provider which exercises are safe for you. Do exercises exactly as told by your health care provider and adjust them as directed. It is normal to feel mild stretching, pulling, tightness, or discomfort as you do these exercises. Stop right away if you feel sudden pain or your pain gets worse. Do not begin these exercises until told by your health care provider. Stretching and range-of-motion exercises Cervical side bending  6. Using good posture, sit on a stable chair or stand up. 7. Without moving your shoulders, slowly tilt your left / right ear to your shoulder until you feel a stretch in the opposite side neck muscles. You should be looking straight ahead. 8. Hold for _____15-30_____ seconds. 9. Repeat with the other side of your neck. Repeat ____3______ times. Complete this exercise ___3_______ times a day. Cervical rotation  6. Using good posture, sit on a stable chair or stand up. 7. Slowly turn your head to the side as if you are looking over your left / right shoulder. ? Keep your eyes level with the ground. ? Stop when you feel a stretch along the side and the back of your neck. 8. Hold for ___15-30_______ seconds. 9. Repeat this by turning to your other side. Repeat ____3______ times. Complete this exercise _____3_____ times a day. Thoracic extension and pectoral  stretch 6. Roll a towel or a small blanket so it is about 4 inches (10 cm) in diameter. 7. Lie down on your back on a firm surface. 8. Put the towel lengthwise, under your spine in the middle of your back. It should not be under your shoulder blades. The towel should line up with your spine from your middle back to your lower back. 9. Put your hands behind your head and let your elbows fall out to your sides. 10. Hold for ____15-30______ seconds. Repeat ___3_______ times. Complete this exercise ____3______ times a day. Strengthening exercises Isometric upper cervical flexion 5. Lie on your back with a thin pillow behind your head and a small rolled-up towel under your neck. 6. Gently tuck your chin toward your chest and nod your head down to look toward your feet. Do not lift your head off the pillow. 7. Hold for ___15-30_______ seconds. 8. Release the tension slowly. Relax your neck muscles completely before you repeat this exercise. Repeat _____3_____ times. Complete this exercise _____3_____ times a day. Isometric cervical extension  7. Stand about 6 inches (15 cm) away from a wall, with your back facing the wall. 8. Place a soft object, about 6-8 inches (15-20 cm) in diameter, between the back of your head and the wall. A soft object could be a small pillow, a ball, or a folded towel. 9. Gently tilt your head back and press into the soft object. Keep your jaw and forehead relaxed. 10. Hold for ___15-30_______ seconds. 11. Release the tension  slowly. Relax your neck muscles completely before you repeat this exercise. Repeat ___3_____ times. Complete this exercise ____3______ times a day. Posture and body mechanics Body mechanics refers to the movements and positions of your body while you do your daily activities. Posture is part of body mechanics. Good posture and healthy body mechanics can help to relieve stress in your body's tissues and joints. Good posture means that your spine is in  its natural S-curve position (your spine is neutral), your shoulders are pulled back slightly, and your head is not tipped forward. The following are general guidelines for applying improved posture and body mechanics to your everyday activities. Sitting  1. When sitting, keep your spine neutral and keep your feet flat on the floor. Use a footrest, if necessary, and keep your thighs parallel to the floor. Avoid rounding your shoulders, and avoid tilting your head forward. 2. When working at a desk or a computer, keep your desk at a height where your hands are slightly lower than your elbows. Slide your chair under your desk so you are close enough to maintain good posture. 3. When working at a computer, place your monitor at a height where you are looking straight ahead and you do not have to tilt your head forward or downward to look at the screen. Standing   When standing, keep your spine neutral and keep your feet about hip-width apart. Keep a slight bend in your knees. Your ears, shoulders, and hips should line up.  When you do a task in which you stand in one place for a long time, place one foot up on a stable object that is 2-4 inches (5-10 cm) high, such as a footstool. This helps keep your spine neutral. Resting When lying down and resting, avoid positions that are most painful for you. Try to support your neck in a neutral position. You can use a contour pillow or a small rolled-up towel. Your pillow should support your neck but not push on it. This information is not intended to replace advice given to you by your health care provider. Make sure you discuss any questions you have with your health care provider. Document Revised: 08/14/2018 Document Reviewed: 01/23/2018 Elsevier Patient Education  2020 [employer] Muscle Cramps and Spasms Muscle cramps and spasms occur when a muscle or muscles tighten and you have no control over this tightening (involuntary muscle contraction). They  are a common problem and can develop in any muscle. The most common place is in the calf muscles of the leg. Muscle cramps and muscle spasms are both involuntary muscle contractions, but there are some differences between the two:  Muscle cramps are painful. They come and go and may last for a few seconds or up to 15 minutes. Muscle cramps are often more forceful and last longer than muscle spasms.  Muscle spasms may or may not be painful. They may also last just a few seconds or much longer. Certain medical conditions, such as diabetes or Parkinson's disease, can make it more likely to develop cramps or spasms. However, cramps or spasms are usually not caused by a serious underlying problem. Common causes include:  Doing more physical work or exercise than your body is ready for (overexertion).  Overuse from repeating certain movements too many times.  Remaining in a certain position for a long period of time.  Improper preparation, form, or technique while playing a sport or doing an activity.  Dehydration.  Injury.  Side effects of some medicines.  Abnormally low levels of the salts and minerals in your blood (electrolytes), especially potassium and calcium. This could happen if you are taking water pills (diuretics) or if you are pregnant. In many cases, the cause of muscle cramps or spasms is not known. Follow these instructions at home: Managing pain and stiffness      Try massaging, stretching, and relaxing the affected muscle. Do this for several minutes at a time.  If directed, apply heat to tight or tense muscles as often as told by your health care provider. Use the heat source that your health care provider recommends, such as a moist heat pack or a heating pad. ? Place a towel between your skin and the heat source. ? Leave the heat on for 20-30 minutes. ? Remove the heat if your skin turns bright red. This is especially important if you are unable to feel pain, heat, or  cold. You may have a greater risk of getting burned.  If directed, put ice on the affected area. This may help if you are sore or have pain after a cramp or spasm. ? Put ice in a plastic bag. ? Place a towel between your skin and the bag. ? Leavethe ice on for 20 minutes, 2-3 times a day.  Try taking hot showers or baths to help relax tight muscles. Eating and drinking  Drink enough fluid to keep your urine pale yellow. Staying well hydrated may help prevent cramps or spasms.  Eat a healthy diet that includes plenty of nutrients to help your muscles function. A healthy diet includes fruits and vegetables, lean protein, whole grains, and low-fat or nonfat dairy products. General instructions  If you are having frequent cramps, avoid intense exercise for several days.  Take over-the-counter and prescription medicines only as told by your health care provider.  Pay attention to any changes in your symptoms.  Keep all follow-up visits as told by your health care provider. This is important. Contact a health care provider if:  Your cramps or spasms get more severe or happen more often.  Your cramps or spasms do not improve over time. Summary  Muscle cramps and spasms occur when a muscle or muscles tighten and you have no control over this tightening (involuntary muscle contraction).  The most common place for cramps or spasms to occur is in the calf muscles of the leg.  Massaging, stretching, and relaxing the affected muscle may relieve the cramp or spasm.  Drink enough fluid to keep your urine pale yellow. Staying well hydrated may help prevent cramps or spasms. This information is not intended to replace advice given to you by your health care provider. Make sure you discuss any questions you have with your health care provider. Document Revised: 09/17/2017 Document Reviewed: 09/17/2017 Elsevier Patient Education  2020 [employer].  Tennis Elbow  Tennis elbow (lateral  epicondylitis) is inflammation of tendons in your outer forearm, near your elbow. Tendons are tissues that connect muscle to bone. When you have tennis elbow, inflammation affects the tendons that you use to bend your wrist and move your hand up. Inflammation occurs in the lower part of the upper arm bone (humerus), where the tendons connect to the bone (lateral epicondyle). Tennis elbow often affects people who play tennis, but anyone may get the condition from repeatedly extending the wrist or turning the forearm. What are the causes? This condition is usually caused by repeatedly extending the wrist, turning the forearm, and using the hands. It can result  from sports or work that requires repetitive forearm movements. In some cases, it may be caused by a sudden injury. What increases the risk? You are more likely to develop tennis elbow if you play tennis or another racket sport. You also have a higher risk if you frequently use your hands for work. Besides people who play tennis, others at greater risk include:  Musicians.  Carpenters, painters, and plumbers.  Cooks.  Cashiers.  People who work in [employer].  Holiday representative workers.  Butchers.  People who use computers. What are the signs or symptoms? Symptoms of this condition include:  Pain and tenderness in the forearm and the outer part of the elbow. Pain may be felt only when using the arm, or it may be there all the time.  A burning feeling that starts in the elbow and spreads down the forearm.  A weak grip in the hand. How is this diagnosed? This condition may be diagnosed based on:  Your symptoms and medical history.  A physical exam.  X-rays.  MRI. How is this treated? Resting and icing your arm is often the first treatment. Your health care provider may also recommend:  Medicines to reduce pain and inflammation. These may be in the form of a pill, topical gels, or shots of a steroid medicine (cortisone).  An  elbow strap to reduce stress on the area.  Physical therapy. This may include massage or exercises.  An elbow brace to restrict the movements that cause symptoms. If these treatments do not help relieve your symptoms, your health care provider may recommend surgery to remove damaged muscle and reattach healthy muscle to bone. Follow these instructions at home: Activity  Rest your elbow and wrist and avoid activities that cause symptoms, as told by your health care provider.  Do physical therapy exercises as instructed.  If you lift an object, lift it with your palm facing up. This reduces stress on your elbow. Lifestyle  If your tennis elbow is caused by sports, check your equipment and make sure that: ? You are using it correctly. ? It is the best fit for you.  If your tennis elbow is caused by work or computer use, take frequent breaks to stretch your arm. Talk with your manager about ways to manage your condition at work. If you have a brace:  Wear the brace or strap as told by your health care provider. Remove it only as told by your health care provider.  Loosen the brace if your fingers tingle, become numb, or turn cold and blue.  Keep the brace clean.  If the brace is not waterproof, ask if you may remove it for bathing. If you must keep the brace on while bathing: ? Do not let it get wet. ? Cover it with a watertight covering when you take a bath or a shower. General instructions   If directed, put ice on the painful area: ? Put ice in a plastic bag. ? Place a towel between your skin and the bag. ? Leave the ice on for 20 minutes, 2-3 times a day.  Take over-the-counter and prescription medicines only as told by your health care provider.  Keep all follow-up visits as told by your health care provider. This is important. Contact a health care provider if:  You have pain that gets worse or does not get better with treatment.  You have numbness or weakness in your  forearm, hand, or fingers. Summary  Tennis elbow (lateral epicondylitis) is inflammation  of tendons in your outer forearm, near your elbow.  Common symptoms include pain and tenderness in your forearm and the outer part of your elbow.  This condition is usually caused by repeatedly extending your wrist, turning your forearm, and using your hands.  The first treatment is often resting and icing your arm to relieve symptoms. Further treatment may include taking medicine, getting physical therapy, wearing a brace or strap, or having surgery. This information is not intended to replace advice given to you by your health care provider. Make sure you discuss any questions you have with your health care provider. Document Revised: 01/18/2018 Document Reviewed: 02/06/2017 Elsevier Patient Education  2020 [employer].  Tennis Elbow Rehab Ask your health care provider which exercises are safe for you. Do exercises exactly as told by your health care provider and adjust them as directed. It is normal to feel mild stretching, pulling, tightness, or discomfort as you do these exercises. Stop right away if you feel sudden pain or your pain gets worse. Do not begin these exercises until told by your health care provider. Stretching and range-of-motion exercises These exercises warm up your muscles and joints and improve the movement and flexibility of your elbow. These exercises also help to relieve pain, numbness, and tingling. Wrist flexion, assisted  12. Straighten your left / right elbow in front of you with your palm facing down toward the floor. ? If told by your health care provider, bend your left / right elbow to a 90-degree angle (right angle) at your side. 13. With your other hand, gently push over the back of your left / right hand so your fingers point toward the floor (flexion). Stop when you feel a gentle stretch on the back of your forearm. 14. Hold this position for ___15-30_______  seconds. Repeat ____3______ times. Complete this exercise ______3____ times a day. Wrist extension, assisted  18. Straighten your left / right elbow in front of you with your palm facing up toward the ceiling. ? If told by your health care provider, bend your left / right elbow to a 90-degree angle (right angle) at your side. 19. With your other hand, gently pull your left / right hand and fingers toward the floor (extension). Stop when you feel a gentle stretch on the palm side of your forearm. 20. Hold this position for ___15-30_______ seconds. Repeat ______3____ times. Complete this exercise ______3___ times a day. Assisted forearm rotation, supination 15. Sit or stand with your left / right elbow bent to a 90-degree angle (right angle) at your side. 16. Using your uninjured hand, turn (rotate) your left / right palm up toward the ceiling (supination) until you feel a gentle stretch along the inside of your forearm. 17. Hold this position for ___15-30_______ seconds. Repeat _____3_____ times. Complete this exercise _____3_____ times a day. Assisted forearm rotation, pronation 11. Sit or stand with your left / right elbow bent to a 90-degree angle (right angle) at your side. 12. Using your uninjured hand, rotate your left / right palm down toward the floor (pronation) until you feel a gentle stretch along the outside of your forearm. 13. Hold this position for __15-30________ seconds. Repeat ____3______ times. Complete this exercise _____3_____ times a day. Strengthening exercises These exercises build strength and endurance in your forearm and elbow. Endurance is the ability to use your muscles for a long time, even after they get tired. Radial deviation  16. Stand with a ___none_______ weight or a hammer in your left /  right hand. Or, sit while holding a rubber exercise band or tubing, with your left / right forearm supported on a table or countertop. ? If you are standing, position your  forearm so that your thumb is facing forward. If you are sitting, position your forearm so that the thumb is facing the ceiling. This is the neutral position. 17. Raise your hand upward in front of you so your thumb moves toward the ceiling (radial deviation), or pull up on the rubber tubing. Keep your forearm and elbow still while you move your wrist only. 18. Hold this position for __15-30________ seconds. 19. Slowly return to the starting position. Repeat ____3______ times. Complete this exercise ____3______ times a day. Wrist extension, eccentric 6. Sit with your left / right forearm palm-down and supported on a table or other surface. Let your left / right wrist extend over the edge of the surface. 7. Hold a ____none______ weight or a piece of exercise band or tubing in your left / right hand. ? If using a rubber exercise band or tubing, hold the other end of the tubing with your other hand. 8. Use your uninjured hand to move your left / right hand up toward the ceiling. 9. Take your uninjured hand away and slowly return to the starting position using only your left / right hand. Lowering your arm under tension is called eccentric extension. Repeat __15-30________ times. Complete this exercise _____3_____ times a day. Wrist extension Do not do this exercise if it causes pain at the outside of your elbow. Only do this exercise once instructed by your health care provider. 1. Sit with your left / right forearm supported on a table or other surface and your palm turned down toward the floor. Let your left / right wrist extend over the edge of the surface. 2. Hold a ____none______ weight or a piece of rubber exercise band or tubing. ? If you are using a rubber exercise band or tubing, hold the band or tubing in place with your other hand to provide resistance. 3. Slowly bend your wrist so your hand moves up toward the ceiling (extension). Move only your wrist, keeping your forearm and elbow  still. 4. Hold this position for ___15-30_______ seconds. 5. Slowly return to the starting position. Repeat ____3______ times. Complete this exercise _____3_____ times a day. Forearm rotation, supination To do this exercise, you will need a lightweight hammer or rubber mallet. 1. Sit with your left / right forearm supported on a table or other surface. Bend your elbow to a 90-degree angle (right angle). Position your forearm so that your palm is facing down toward the floor, with your hand resting over the edge of the table. 2. Hold a hammer in your left / right hand. ? To make this exercise easier, hold the hammer near the head of the hammer. ? To make this exercise harder, hold the hammer near the end of the handle. 3. Without moving your wrist or elbow, slowly rotate your forearm so your palm faces up toward the ceiling (supination). 4. Hold this position for __15-30________ seconds. 5. Slowly return to the starting position. Repeat ____3______ times. Complete this exercise ____3______ times a day. Shoulder blade squeeze 1. Sit in a stable chair or stand with good posture. If you are sitting down, do not let your back touch the back of the chair. 2. Your arms should be at your sides with your elbows bent to a 90-degree angle (right angle). Position your forearms so that  your thumbs are facing the ceiling (neutral position). 3. Without lifting your shoulders up, squeeze your shoulder blades tightly together. 4. Hold this position for __15-30________ seconds. 5. Slowly release and return to the starting position. Repeat ___3_______ times. Complete this exercise _____3_____ times a day. This information is not intended to replace advice given to you by your health care provider. Make sure you discuss any questions you have with your health care provider. Document Revised: 08/15/2018 Document Reviewed: 06/18/2018 Elsevier Patient Education  2020 Else[employer] Inc[employer]   Ataxia  Ataxia is a  condition that causes unsteadiness when walking and standing, poor coordination of body movements, and difficulty keeping a straight (upright) posture. It occurs because of a problem with the part of the brain that controls coordination and stability (cerebellum). Ataxia can develop later in life (acquired ataxia), during your 20s or 30s or even into your 60s or later. This type of ataxia develops when another medical condition, such as a stroke, damages the cerebellum. Ataxia also may be present early in life (non-acquired ataxia). There are two main types of non-acquired ataxia: Congenital. This type is present at birth. Hereditary. This type is passed from parent to child. The most common form of hereditary non-acquired ataxia is Friedreich ataxia. What are the causes? Acquired ataxia may be caused by: Changes in the nervous system (neurodegenerative changes). Changes throughout the body (systemic disorders). A lot of exposure to: Certain medicines such as phenytoin and lithium. Solvents. These are cleaning fluids such as paint thinner, nail polish remover, carpet cleaner, and degreasers. Alcohol abuse (alcoholism). Medical conditions, such as: Celiac disease. Hypothyroidism. A lack (deficiency) of vitamin E, vitamin B12, or thiamine. Brain tumors. Multiple sclerosis. Cerebral palsy. Stroke. Paraneoplastic syndromes. Viral infections. Head injury. Malnutrition. Congenital and hereditary ataxia are caused by problems that are present in genes before birth. What are the signs or symptoms? Signs and symptoms of ataxia vary depending on the cause. They may include: Being unsteady. Walking with the legs wide apart (wide stance) to keep one's balance. Uncontrolled shaking (tremor). Poorly coordinated body movements. Difficulty maintaining an upright posture. Fatigue. Changes in speech. Changes in vision. Involuntary eye movements (nystagmus). Difficulty swallowing. Difficulty  writing. Muscle tightening that you cannot control (muscle spasms). How is this diagnosed? Ataxia may be diagnosed based on: Your personal and family medical history. A physical exam. Imaging tests, such as a CT scan or MRI. Spinal tap (lumbar puncture). This procedure involves using a needle to take a sample of the fluid around your brain and spinal cord. Genetic testing. How is this treated? The underlying condition that causes your ataxia needs to be treated. If the cause is a brain tumor, you may need surgery. Treatment also focuses on helping you live with ataxia and improving your quality of life (supportive treatments). This may involve: Learning ways to improve coordination and move around more carefully (physical therapy). Learning ways to improve your ability to do daily tasks, such as bathing and feeding yourself (occupational therapy). Using devices to help you move around, eat, or communicate (assistive devices), such as a walker, modified eating utensils, and communication aids. Learning ways to improve speech and swallowing (speech therapy). Follow these instructions at home: Preventing falls Lie down right away if you become very unsteady, dizzy, or nauseous, or if you feel like you are going to faint. Do not get up until all of those feelings pass. Keep your home well-lit. Use night-lights as needed. Remove tripping hazards, such as rugs, cords, and clutter.  Install grab bars by the toilet and in the tub and shower. Use assistive devices such as a cane, walker, or wheelchair as needed to keep your balance. General instructions Do not drink alcohol. Ask your health care provider what activities are safe for you, and what activities you should avoid. Take over-the-counter and prescription medicines only as told by your health care provider. Get help right away if you: Have unsteadiness that suddenly worsens. Have any of these: Severe headaches. Chest pain. Abdominal  pain. Weakness or numbness on one side of your body. Vision problems. Difficulty speaking. An irregular heartbeat. A very fast pulse. Feel confused. Summary Ataxia is a condition that causes unsteadiness when walking and standing, poor coordination of body movements, and difficulty keeping a straight (upright) posture. Ataxia occurs because of a problem with the part of the brain that controls coordination and stability (cerebellum). The underlying condition that causes your ataxia needs to be treated. Treatment also focuses on helping you live with ataxia and improving your quality of life (supportive treatments). Lie down right away if you become very unsteady, dizzy, or nauseous, or if you feel like you are going to faint. This information is not intended to replace advice given to you by your health care provider. Make sure you discuss any questions you have with your health care provider. Document Revised: 04/06/2017 Document Reviewed: 02/23/2017 Elsevier Patient Education  2020 Elsevier Inc[employer]  Weakness Weakness is a lack of strength. You may feel weak all over your body (generalized), or you may feel weak in one specific part of your body (focal). Common causes of weakness include:  Infection and immune system disorders.  Physical exhaustion.  Internal bleeding or other blood loss that results in a lack of red blood cells (anemia).  Dehydration.  An imbalance in mineral (electrolyte) levels, such as potassium.  Heart disease, circulation problems, or stroke. Other causes include:  Some medicines or cancer treatment.  Stress, anxiety, or depression.  Nervous system disorders.  Thyroid disorders.  Loss of muscle strength because of age or inactivity.  Poor sleep quality or sleep disorders. The cause of your weakness may not be known. Some causes of weakness can be serious, so it is important to see your health care provider. Follow these instructions at  home: Activity  Rest as needed.  Try to get enough sleep. Most adults need 7-8 hours of quality sleep each night. Talk to your health care provider about how much sleep you need each night.  Do exercises, such as arm curls and leg raises, for 30 minutes at least 2 days a week or as told by your health care provider. This helps build muscle strength.  Consider working with a physical therapist or trainer who can develop an exercise plan to help you gain muscle strength. General instructions   Take over-the-counter and prescription medicines only as told by your health care provider.  Eat a healthy, well-balanced diet. This includes: ? Proteins to build muscles, such as lean meats and fish. ? Fresh fruits and vegetables. ? Carbohydrates to boost energy, such as whole grains.  Drink enough fluid to keep your urine pale yellow.  Keep all follow-up visits as told by your health care provider. This is important. Contact a health care provider if your weakness:  Does not improve or gets worse.  Affects your ability to think clearly.  Affects your ability to do your normal daily activities. Get help right away if you:  Develop sudden weakness, especially on one  side of your face or body.  Have chest pain.  Have trouble breathing or shortness of breath.  Have problems with your vision.  Have trouble talking or swallowing.  Have trouble standing or walking.  Are light-headed or lose consciousness. Summary  Weakness is a lack of strength. You may feel weak all over your body or just in one specific part of your body.  Weakness can be caused by a variety of things. In some cases, the cause may be unknown.  Rest as needed, and try to get enough sleep. Most adults need 7-8 hours of quality sleep each night.  Eat a healthy, well-balanced diet. This information is not intended to replace advice given to you by your health care provider. Make sure you discuss any questions you  have with your health care provider. Document Revised: 11/28/2017 Document Reviewed: 11/28/2017 Elsevier Patient Education  2020 [employer] Inc.  Paresthesia Paresthesia is an abnormal burning or prickling sensation. It is usually felt in the hands, arms, legs, or feet. However, it may occur in any part of the body. Usually, paresthesia is not painful. It may feel like:  Tingling or numbness.  Buzzing.  Itching. Paresthesia may occur without any clear cause, or it may be caused by:  Breathing too quickly (hyperventilation).  Pressure on a nerve.  An underlying medical condition.  Side effects of a medication.  Nutritional deficiencies.  Exposure to toxic chemicals. Most people experience temporary (transient) paresthesia at some time in their lives. For some people, it may be long-lasting (chronic) because of an underlying medical condition. If you have paresthesia that lasts a long time, you may need to be evaluated by your health care provider. Follow these instructions at home: Alcohol use   Do not drink alcohol if: ? Your health care provider tells you not to drink. ? You are pregnant, may be pregnant, or are planning to become pregnant.  If you drink alcohol: ? Limit how much you use to:  0-1 drink a day for women.  0-2 drinks a day for men. ? Be aware of how much alcohol is in your drink. In the [place] one drink equals one 12 oz bottle of beer (355 mL), one 5 oz glass of wine (148 mL), or one 1 oz glass of hard liquor (44 mL). Nutrition   Eat a healthy diet. This includes: ? Eating foods that are high in fiber, such as fresh fruits and vegetables, whole grains, and beans. ? Limiting foods that are high in fat and processed sugars, such as fried or sweet foods. General instructions  Take over-the-counter and prescription medicines only as told by your health care provider.  Do not use any products that contain nicotine or tobacco, such as cigarettes and  e-cigarettes. These can keep blood from reaching damaged nerves. If you need help quitting, ask your health care provider.  If you have diabetes, work closely with your health care provider to keep your blood sugar under control.  If you have numbness in your feet: ? Check every day for signs of injury or infection. Watch for redness, warmth, and swelling. ? Wear padded socks and comfortable shoes. These help protect your feet.  Keep all follow-up visits as told by your health care provider. This is important. Contact a health care provider if you:  Have paresthesia that gets worse or does not go away.  Have a burning or prickling feeling that gets worse when you walk.  Have pain, cramps, or dizziness.  Develop  a rash. Get help right away if you:  Feel weak.  Have trouble walking or moving.  Have problems with speech, understanding, or vision.  Feel confused.  Cannot control your bladder or bowel movements.  Have numbness after an injury.  Develop new weakness in an arm or leg.  Faint. Summary  Paresthesia is an abnormal burning or prickling sensation that is usually felt in the hands, arms, legs, or feet. It may also occur in other parts of the body.  Paresthesia may occur without any clear cause, or it may be caused by breathing too quickly (hyperventilation), pressure on a nerve, an underlying medical condition, side effects of a medication, nutritional deficiencies, or exposure to toxic chemicals.  If you have paresthesia that lasts a long time, you may need to be evaluated by your health care provider. This information is not intended to replace advice given to you by your health care provider. Make sure you discuss any questions you have with your health care provider. Document Revised: 05/20/2018 Document Reviewed: 05/03/2017 Elsevier Patient Education  2020 [employer].

## 2020-06-04 ENCOUNTER — Ambulatory Visit: Payer: Self-pay | Admitting: *Deleted

## 2020-06-04 ENCOUNTER — Other Ambulatory Visit: Payer: Self-pay

## 2020-06-04 DIAGNOSIS — R55 Syncope and collapse: Secondary | ICD-10-CM

## 2020-06-04 NOTE — Progress Notes (Signed)
One lab draw attempt. Unsuccessful. When moving to second site, pt became pale, diaphoretic. Reported he felt dizzy and "out of himself." Removed tourniquet, instructed pt to lie head back and rest. Turned fan on to pt to cool down. Denied n/v. Sx passed within 2-3 minutes. Pt ambulatory without difficulty. Rescheduled labs for next Friday 06/11/20.

## 2020-06-10 ENCOUNTER — Encounter: Payer: Self-pay | Admitting: Registered Nurse

## 2020-06-10 ENCOUNTER — Telehealth: Payer: Self-pay | Admitting: Registered Nurse

## 2020-06-10 DIAGNOSIS — R202 Paresthesia of skin: Secondary | ICD-10-CM

## 2020-06-10 NOTE — Telephone Encounter (Signed)
RN Rolly Salter attempted to draw patient labs and vasovagal response by patient and refused to reschedule labs at that time due to anxiety.  Patient would like to proceed with neurology referral.  Provider of choice Dre[facility] [address] [place] [address] [place] [address]  [phone].  Second choice Scherry Ran [facility] [place] [address] [phone] if Dr Everlena Cooper not accepting patients.  Electronic referral entered for patient to Dr Everlena Cooper office today.  Patient may defer labs at this time until seen by neurology.  RN Rolly Salter notified.

## 2020-06-24 NOTE — Telephone Encounter (Signed)
Patient contacted via telephone.  Neurology office contacted him and left message but he wasn't able to answer so hasn't scheduled appt yet.  He is going to follow up with their office.

## 2020-07-20 NOTE — Telephone Encounter (Signed)
Patient contacted Dr Scherry Ran office self-referral.  New referral entered for patient also as he changed mind for provider preference and notified clinic staff.  Patient stated received message from their office and will return call today to schedule appt with Dr Renne Crigler for evaluation of his paresthesias.

## 2020-07-29 NOTE — Telephone Encounter (Signed)
Spoke with patient today neurology initial appt 24 Sep 2020 scheduled.  No change in symptoms.

## 2020-07-30 ENCOUNTER — Ambulatory Visit: Payer: No Typology Code available for payment source | Admitting: Family Medicine

## 2020-09-10 ENCOUNTER — Encounter: Payer: Self-pay | Admitting: Family Medicine

## 2020-09-10 ENCOUNTER — Other Ambulatory Visit: Payer: Self-pay

## 2020-09-10 ENCOUNTER — Ambulatory Visit (INDEPENDENT_AMBULATORY_CARE_PROVIDER_SITE_OTHER): Payer: No Typology Code available for payment source | Admitting: Family Medicine

## 2020-09-10 VITALS — BP 136/82 | HR 59 | Temp 96.8°F | Ht 72.0 in | Wt 181.4 lb

## 2020-09-10 DIAGNOSIS — Z0000 Encounter for general adult medical examination without abnormal findings: Secondary | ICD-10-CM | POA: Diagnosis not present

## 2020-09-10 DIAGNOSIS — E7800 Pure hypercholesterolemia, unspecified: Secondary | ICD-10-CM | POA: Insufficient documentation

## 2020-09-10 DIAGNOSIS — F419 Anxiety disorder, unspecified: Secondary | ICD-10-CM

## 2020-09-10 DIAGNOSIS — Z125 Encounter for screening for malignant neoplasm of prostate: Secondary | ICD-10-CM | POA: Diagnosis not present

## 2020-09-10 LAB — URINALYSIS, ROUTINE W REFLEX MICROSCOPIC
Bilirubin Urine: NEGATIVE
Hgb urine dipstick: NEGATIVE
Ketones, ur: NEGATIVE
Leukocytes,Ua: NEGATIVE
Nitrite: NEGATIVE
RBC / HPF: NONE SEEN (ref 0–?)
Specific Gravity, Urine: 1.015 (ref 1.000–1.030)
Total Protein, Urine: NEGATIVE
Urine Glucose: NEGATIVE
Urobilinogen, UA: 0.2 (ref 0.0–1.0)
pH: 5.5 (ref 5.0–8.0)

## 2020-09-10 LAB — COMPREHENSIVE METABOLIC PANEL
ALT: 17 U/L (ref 0–53)
AST: 16 U/L (ref 0–37)
Albumin: 4.9 g/dL (ref 3.5–5.2)
Alkaline Phosphatase: 78 U/L (ref 39–117)
BUN: 14 mg/dL (ref 6–23)
CO2: 29 mEq/L (ref 19–32)
Calcium: 10.1 mg/dL (ref 8.4–10.5)
Chloride: 104 mEq/L (ref 96–112)
Creatinine, Ser: 1.16 mg/dL (ref 0.40–1.50)
GFR: 73.83 mL/min (ref >60.00)
Glucose, Bld: 103 mg/dL — ABNORMAL HIGH (ref 70–99)
Potassium: 4.1 mEq/L (ref 3.5–5.1)
Sodium: 144 mEq/L (ref 135–145)
Total Bilirubin: 1 mg/dL (ref 0.2–1.2)
Total Protein: 7.5 g/dL (ref 6.0–8.3)

## 2020-09-10 LAB — CBC
HCT: 46.3 % (ref 39.0–52.0)
Hemoglobin: 15.8 g/dL (ref 13.0–17.0)
MCHC: 34.1 g/dL (ref 30.0–36.0)
MCV: 88.3 fl (ref 78.0–100.0)
Platelets: 264 10*3/uL (ref 150.0–400.0)
RBC: 5.24 Mil/uL (ref 4.22–5.81)
RDW: 13.1 % (ref 11.5–15.5)
WBC: 4.2 10*3/uL (ref 4.0–10.5)

## 2020-09-10 LAB — LIPID PANEL
Cholesterol: 250 mg/dL — ABNORMAL HIGH (ref 0–200)
HDL: 69.4 mg/dL (ref >39.00)
LDL Cholesterol: 168 mg/dL — ABNORMAL HIGH (ref 0–99)
NonHDL: 180.55
Total CHOL/HDL Ratio: 4
Triglycerides: 63 mg/dL (ref 0.0–149.0)
VLDL: 12.6 mg/dL (ref 0.0–40.0)

## 2020-09-10 LAB — PSA: PSA: 0.42 ng/mL (ref 0.10–4.00)

## 2020-09-10 MED ORDER — ESCITALOPRAM OXALATE 10 MG PO TABS: 10.0000 mg | ORAL_TABLET | Freq: Every day | ORAL | 0 refills | Status: DC

## 2020-09-10 NOTE — Progress Notes (Addendum)
Established Patient Office Visit  Subjective:  Patient ID: John [name], male    DOB: January 11, 1971  Age: 50 y.o. MRN: [id number]  CC:  Chief Complaint  Patient presents with  . Follow-up    C/O tension in neck seems to come and go. Patient would also like blood drawn for work. Fasting for labs.     HPI John [name] presents for a physical exam and health check.  He is healthy as far as he knows.  He has had some problems with tinnitus and sinking spells.  Nurse at work sent patient to a neurologist for evaluation.  Evaluation was reassuring and patient said that the neurologist suggested that he see me in consultation for possible anxiety.  Patient does feel tension in the back of his neck from time to time.  He is in a leadership role at work.  He has a older daughter who is on the spectrum.  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  Past Medical History:  Diagnosis Date  . Hyperlipidemia     Past Surgical History:  Procedure Laterality Date  . TONSILLECTOMY      No family history on file.  Social History   Socioeconomic History  . Marital status: Married    Spouse name: Not on file  . Number of children: Not on file  . Years of education: Not on file  . Highest education level: Not on file  Occupational History  . Not on file  Tobacco Use  . Smoking status: Former Games developer  . Smokeless tobacco: Never Used  Substance and Sexual Activity  . Alcohol use: Yes    Comment: no more than 1-2 servings in a given setting.  . Drug use: Never  . Sexual activity: Not on file  Other Topics Concern  . Not on file  Social History Narrative  . Not on file   Social Determinants of Health   Financial Resource Strain: Not on file  Food Insecurity: Not on file  Transportation Needs: Not on file  Physical Activity: Not on file  Stress: Not on file  Social Connections: Not on file  Intimate Partner Violence: Not on file    Outpatient Medications Prior to Visit   Medication Sig Dispense Refill  . fluticasone (FLONASE) 50 MCG/ACT nasal spray Place 1 spray into both nostrils 2 (two) times daily. (Patient not taking: Reported on 05/04/2020) 16 g 6  . loratadine (CLARITIN) 10 MG tablet Take 1 tablet (10 mg total) by mouth daily. (Patient not taking: Reported on 05/04/2020) 90 tablet 3  . sodium chloride (OCEAN) 0.65 % SOLN nasal spray Place 2 sprays into both nostrils every 2 (two) hours while awake. (Patient not taking: Reported on 05/04/2020)  0   No facility-administered medications prior to visit.    Allergies  Allergen Reactions  . Bee Venom Rash    ROS Review of Systems  Constitutional: Negative.   HENT: Negative.  Negative for hearing loss.   Eyes: Negative for photophobia and visual disturbance.  Respiratory: Negative.   Cardiovascular: Negative.   Gastrointestinal: Negative.   Endocrine: Negative for polyphagia and polyuria.  Genitourinary: Negative for difficulty urinating, frequency and urgency.  Musculoskeletal: Positive for neck stiffness. Negative for neck pain.  Skin: Negative for color change and pallor.  Neurological: Negative for dizziness, speech difficulty and weakness.  Psychiatric/Behavioral: The patient is nervous/anxious.    Depression screen [facility] 2/9 09/10/2020 09/10/2020 01/25/2018  Decreased Interest - 0 0  Down, Depressed, Hopeless 0  0 0  PHQ - 2 Score 0 0 0  Altered sleeping 0 - 0  Tired, decreased energy 0 - 1  Change in appetite 0 - 1  Feeling bad or failure about yourself  1 - 0  Trouble concentrating 1 - 1  Moving slowly or fidgety/restless 0 - 0  Suicidal thoughts 0 - 0  PHQ-9 Score 2 - 3  Difficult doing work/chores Not difficult at all - -      Objective:    Physical Exam Vitals and nursing note reviewed.  Constitutional:      General: He is not in acute distress.    Appearance: Normal appearance. He is normal weight. He is not ill-appearing, toxic-appearing or diaphoretic.  HENT:     Head:  Normocephalic and atraumatic.     Right Ear: Tympanic membrane, ear canal and external ear normal.     Left Ear: Tympanic membrane, ear canal and external ear normal.     Mouth/Throat:     Mouth: Mucous membranes are moist.     Pharynx: Oropharynx is clear. No oropharyngeal exudate or posterior oropharyngeal erythema.  Eyes:     General: No scleral icterus.       Right eye: No discharge.        Left eye: No discharge.     Conjunctiva/sclera: Conjunctivae normal.     Pupils: Pupils are equal, round, and reactive to light.  Cardiovascular:     Rate and Rhythm: Normal rate and regular rhythm.  Pulmonary:     Effort: Pulmonary effort is normal.     Breath sounds: Normal breath sounds.  Abdominal:     General: Abdomen is flat. Bowel sounds are normal. There is no distension.     Palpations: Abdomen is soft. There is no mass.     Tenderness: There is no abdominal tenderness. There is no guarding or rebound.     Hernia: No hernia is present. There is no hernia in the left inguinal area or right inguinal area.  Genitourinary:    Penis: Normal and uncircumcised. No phimosis, paraphimosis, hypospadias, erythema, tenderness, discharge, swelling or lesions.      Testes:        Right: Mass, tenderness or swelling not present. Right testis is descended.        Left: Mass, tenderness or swelling not present. Left testis is descended.     Epididymis:     Right: Not inflamed.     Left: Not inflamed.     Prostate: Not enlarged, not tender and no nodules present.     Rectum: Guaiac result negative. No mass, tenderness, anal fissure, external hemorrhoid or internal hemorrhoid. Normal anal tone.  Musculoskeletal:     Cervical back: No rigidity or tenderness.     Right lower leg: No edema.     Left lower leg: No edema.  Lymphadenopathy:     Cervical: No cervical adenopathy.     Lower Body: No right inguinal adenopathy. No left inguinal adenopathy.  Skin:    General: Skin is warm and dry.   Neurological:     Mental Status: He is alert and oriented to person, place, and time.     BP 136/82   Pulse (!) 59   Temp (!) 96.8 F (36 C) (Temporal)   Ht 6' (1.829 m)   Wt 181 lb 6.4 oz (82.3 kg)   SpO2 99%   BMI 24.60 kg/m  Wt Readings from Last 3 Encounters:  09/10/20 181 lb 6.4 oz (  82.3 kg)  05/04/20 184 lb 1.4 oz (83.5 kg)  02/15/18 204 lb (92.5 kg)     Health Maintenance Due  Topic Date Due  . HIV Screening  Never done  . Hepatitis C Screening  Never done  . TETANUS/TDAP  Never done  . COLONOSCOPY (Pts 45-74yrs Insurance coverage will need to be confirmed)  Never done    There are no preventive care reminders to display for this patient.  Lab Results  Component Value Date   TSH 3.010 01/25/2018   Lab Results  Component Value Date   WBC 4.3 01/25/2018   HGB 16.0 01/25/2018   HCT 49.2 01/25/2018   MCV 89 01/25/2018   PLT 253 01/25/2018   Lab Results  Component Value Date   NA 142 01/25/2018   K 4.2 01/25/2018   GLUCOSE 75 01/25/2018   BUN 13 01/25/2018   CREATININE 1.12 01/25/2018   BILITOT 0.8 01/25/2018   ALKPHOS 87 01/25/2018   AST 23 01/25/2018   ALT 36 01/25/2018   PROT 7.1 01/25/2018   ALBUMIN 4.8 01/25/2018   CALCIUM 9.5 01/25/2018   Lab Results  Component Value Date   CHOL 239 (H) 01/25/2018   Lab Results  Component Value Date   HDL 60 01/25/2018   Lab Results  Component Value Date   LDLCALC 161 (H) 01/25/2018   Lab Results  Component Value Date   TRIG 92 01/25/2018   Lab Results  Component Value Date   CHOLHDL 4.0 01/25/2018   Lab Results  Component Value Date   HGBA1C 5.5 01/25/2018      Assessment & Plan:   Problem List Items Addressed This Visit      Other   Anxiety   Relevant Medications   escitalopram (LEXAPRO) 10 MG tablet   Healthcare maintenance - Primary   Relevant Orders   CBC   Comprehensive metabolic panel   Lipid panel   PSA   Urinalysis, Routine w reflex microscopic   Ambulatory referral  to Gastroenterology   Elevated LDL cholesterol level      Meds ordered this encounter  Medications  . escitalopram (LEXAPRO) 10 MG tablet    Sig: Take 1 tablet (10 mg total) by mouth at bedtime.    Dispense:  90 tablet    Refill:  0    Follow-up: Return in about 3 months (around 12/11/2020).   Patient was given information on managing high cholesterol.  I expressed my concern about his LDL cholesterol.  He was also given information on managing anxiety and stress that includes exercise and mindfulness based stress reduction.  I will offer him Lexapro to take.  He is not a fan of medicines and may or may not take it but the offers there.  Advised that it is time for his first colonoscopy.  We discussed talking therapy and at this point he does not feel that would be helpful for him. Mliss Sax, MD

## 2020-09-10 NOTE — Patient Instructions (Signed)
Preventing High Cholesterol Cholesterol is a white, waxy substance similar to fat that the human body needs to help build cells. The liver makes all the cholesterol that a person's body needs. Having high cholesterol (hypercholesterolemia) increases your risk for heart disease and stroke. Extra or excess cholesterol comes from the food that you eat. High cholesterol can often be prevented with diet and lifestyle changes. If you already have high cholesterol, you can control it with diet, lifestyle changes, and medicines. How can high cholesterol affect me? If you have high cholesterol, fatty deposits (plaques) may build up on the walls of your blood vessels. The blood vessels that carry blood away from your heart are called arteries. Plaques make the arteries narrower and stiffer. This in turn can:  Restrict or block blood flow and cause blood clots to form.  Increase your risk for heart attack and stroke. What can increase my risk for high cholesterol? This condition is more likely to develop in people who:  Eat foods that are high in saturated fat or cholesterol. Saturated fat is mostly found in foods that come from animal sources.  Are overweight.  Are not getting enough exercise.  Have a family history of high cholesterol (familial hypercholesterolemia). What actions can I take to prevent this? Nutrition  Eat less saturated fat.  Avoid trans fats (partially hydrogenated oils). These are often found in margarine and in some baked goods, fried foods, and snacks bought in packages.  Avoid precooked or cured meat, such as bacon, sausages, or meat loaves.  Avoid foods and drinks that have added sugars.  Eat more fruits, vegetables, and whole grains.  Choose healthy sources of protein, such as fish, poultry, lean cuts of red meat, beans, peas, lentils, and nuts.  Choose healthy sources of fat, such as: ? Nuts. ? Vegetable oils, especially olive oil. ? Fish that have healthy fats,  such as omega-3 fatty acids. These fish include mackerel or salmon.   Lifestyle  Lose weight if you are overweight. Maintaining a healthy body mass index (BMI) can help prevent or control high cholesterol. It can also lower your risk for diabetes and high blood pressure. Ask your health care provider to help you with a diet and exercise plan to lose weight safely.  Do not use any products that contain nicotine or tobacco, such as cigarettes, e-cigarettes, and chewing tobacco. If you need help quitting, ask your health care provider. Alcohol use  Do not drink alcohol if: ? Your health care provider tells you not to drink. ? You are pregnant, may be pregnant, or are planning to become pregnant.  If you drink alcohol: ? Limit how much you use to:  0-1 drink a day for women.  0-2 drinks a day for men. ? Be aware of how much alcohol is in your drink. In the [place] one drink equals one 12 oz bottle of beer (355 mL), one 5 oz glass of wine (148 mL), or one 1 oz glass of hard liquor (44 mL). Activity  Get enough exercise. Do exercises as told by your health care provider.  Each week, do at least 150 minutes of exercise that takes a medium level of effort (moderate-intensity exercise). This kind of exercise: ? Makes your heart beat faster while allowing you to still be able to talk. ? Can be done in short sessions several times a day or longer sessions a few times a week. For example, on 5 days each week, you could walk fast or ride   your bike 3 times a day for 10 minutes each time.   Medicines  Your health care provider may recommend medicines to help lower cholesterol. This may be a medicine to lower the amount of cholesterol that your liver makes. You may need medicine if: ? Diet and lifestyle changes have not lowered your cholesterol enough. ? You have high cholesterol and other risk factors for heart disease or stroke.  Take over-the-counter and prescription medicines only as told by your  health care provider. General information  Manage your risk factors for high cholesterol. Talk with your health care provider about all your risk factors and how to lower your risk.  Manage other conditions that you have, such as diabetes or high blood pressure (hypertension).  Have blood tests to check your cholesterol levels at regular points in time as told by your health care provider.  Keep all follow-up visits as told by your health care provider. This is important. Where to find more information  American Heart Association: www.heart.org  National Heart, Lung, and Blood Institute: PopSteam.is Summary  High cholesterol increases your risk for heart disease and stroke. By keeping your cholesterol level low, you can reduce your risk for these conditions.  High cholesterol can often be prevented with diet and lifestyle changes.  Work with your health care provider to manage your risk factors, and have your blood tested regularly. This information is not intended to replace advice given to you by your health care provider. Make sure you discuss any questions you have with your health care provider. Document Revised: 02/04/2019 Document Reviewed: 02/04/2019 Elsevier Patient Education  2021 Elsevier Inc.  Managing Anxiety, Adult After being diagnosed with an anxiety disorder, you may be relieved to know why you have felt or behaved a certain way. You may also feel overwhelmed about the treatment ahead and what it will mean for your life. With care and support, you can manage this condition and recover from it. How to manage lifestyle changes Managing stress and anxiety Stress is your body's reaction to life changes and events, both good and bad. Most stress will last just a few hours, but stress can be ongoing and can lead to more than just stress. Although stress can play a major role in anxiety, it is not the same as anxiety. Stress is usually caused by something external, such  as a deadline, test, or competition. Stress normally passes after the triggering event has ended.  Anxiety is caused by something internal, such as imagining a terrible outcome or worrying that something will go wrong that will devastate you. Anxiety often does not go away even after the triggering event is over, and it can become long-term (chronic) worry. It is important to understand the differences between stress and anxiety and to manage your stress effectively so that it does not lead to an anxious response. Talk with your health care provider or a counselor to learn more about reducing anxiety and stress. He or she may suggest tension reduction techniques, such as:  Music therapy. This can include creating or listening to music that you enjoy and that inspires you.  Mindfulness-based meditation. This involves being aware of your normal breaths while not trying to control your breathing. It can be done while sitting or walking.  Centering prayer. This involves focusing on a word, phrase, or sacred image that means something to you and brings you peace.  Deep breathing. To do this, expand your stomach and inhale slowly through your nose. Hold  your breath for 3-5 seconds. Then exhale slowly, letting your stomach muscles relax.  Self-talk. This involves identifying thought patterns that lead to anxiety reactions and changing those patterns.  Muscle relaxation. This involves tensing muscles and then relaxing them. Choose a tension reduction technique that suits your lifestyle and personality. These techniques take time and practice. Set aside 5-15 minutes a day to do them. Therapists can offer counseling and training in these techniques. The training to help with anxiety may be covered by some insurance plans. Other things you can do to manage stress and anxiety include:  Keeping a stress/anxiety diary. This can help you learn what triggers your reaction and then learn ways to manage your  response.  Thinking about how you react to certain situations. You may not be able to control everything, but you can control your response.  Making time for activities that help you relax and not feeling guilty about spending your time in this way.  Visual imagery and yoga can help you stay calm and relax.   Medicines Medicines can help ease symptoms. Medicines for anxiety include:  Anti-anxiety drugs.  Antidepressants. Medicines are often used as a primary treatment for anxiety disorder. Medicines will be prescribed by a health care provider. When used together, medicines, psychotherapy, and tension reduction techniques may be the most effective treatment. Relationships Relationships can play a big part in helping you recover. Try to spend more time connecting with trusted friends and family members. Consider going to couples counseling, taking family education classes, or going to family therapy. Therapy can help you and others better understand your condition. How to recognize changes in your anxiety Everyone responds differently to treatment for anxiety. Recovery from anxiety happens when symptoms decrease and stop interfering with your daily activities at home or work. This may mean that you will start to:  Have better concentration and focus. Worry will interfere less in your daily thinking.  Sleep better.  Be less irritable.  Have more energy.  Have improved memory. It is important to recognize when your condition is getting worse. Contact your health care provider if your symptoms interfere with home or work and you feel like your condition is not improving. Follow these instructions at home: Activity  Exercise. Most adults should do the following: ? Exercise for at least 150 minutes each week. The exercise should increase your heart rate and make you sweat (moderate-intensity exercise). ? Strengthening exercises at least twice a week.  Get the right amount and quality of  sleep. Most adults need 7-9 hours of sleep each night. Lifestyle  Eat a healthy diet that includes plenty of vegetables, fruits, whole grains, low-fat dairy products, and lean protein. Do not eat a lot of foods that are high in solid fats, added sugars, or salt.  Make choices that simplify your life.  Do not use any products that contain nicotine or tobacco, such as cigarettes, e-cigarettes, and chewing tobacco. If you need help quitting, ask your health care provider.  Avoid caffeine, alcohol, and certain over-the-counter cold medicines. These may make you feel worse. Ask your pharmacist which medicines to avoid.   General instructions  Take over-the-counter and prescription medicines only as told by your health care provider.  Keep all follow-up visits as told by your health care provider. This is important. Where to find support You can get help and support from these sources:  Self-help groups.  Online and [employer] organizations.  A trusted spiritual leader.  Couples counseling.  Family education  classes.  Family therapy. Where to find more information You may find that joining a support group helps you deal with your anxiety. The following sources can help you locate counselors or support groups near you:  Mental Health America: www.mentalhealthamerica.net  Anxiety and Depression Association of [place] (ADAA): ProgramCam.de  [employer] Mental Illness (NAMI): www.nami.org Contact a health care provider if you:  Have a hard time staying focused or finishing daily tasks.  Spend many hours a day feeling worried about everyday life.  Become exhausted by worry.  Start to have headaches, feel tense, or have nausea.  Urinate more than normal.  Have diarrhea. Get help right away if you have:  A racing heart and shortness of breath.  Thoughts of hurting yourself or others. If you ever feel like you may hurt yourself or others, or have thoughts about taking  your own life, get help right away. You can go to your nearest emergency department or call:  Your local emergency services (911 in the [place]  A suicide crisis helpline, such as the National Suicide Prevention Lifeline at (714)286-3443. This is open 24 hours a day. Summary  Taking steps to learn and use tension reduction techniques can help calm you and help prevent triggering an anxiety reaction.  When used together, medicines, psychotherapy, and tension reduction techniques may be the most effective treatment.  Family, friends, and partners can play a big part in helping you recover from an anxiety disorder. This information is not intended to replace advice given to you by your health care provider. Make sure you discuss any questions you have with your health care provider. Document Revised: 09/24/2018 Document Reviewed: 09/24/2018 Elsevier Patient Education  2021 Elsevier Inc.  Mindfulness-Based Stress Reduction Mindfulness-based stress reduction (MBSR) is a program that helps people learn to practice mindfulness. Mindfulness is the practice of intentionally paying attention to the present moment. MBSR focuses on developing self-awareness, which allows you to respond to life stress without judgment or negative emotions. It can be learned and practiced through techniques such as education, breathing exercises, meditation, and yoga. MBSR includes several mindfulness techniques in one program. MBSR works best when you understand the treatment, are willing to try new things, and can commit to spending time practicing what you learn. MBSR training may include learning about:  How your emotions, thoughts, and reactions affect your body.  New ways to respond to things that cause negative thoughts to start (triggers).  How to notice your thoughts and let go of them.  Practicing awareness of everyday things that you normally do without thinking.  The techniques and goals of different types  of meditation. What are the benefits of MBSR? MBSR can have many benefits, which include helping you to:  Develop self-awareness. This refers to knowing and understanding yourself.  Learn skills and attitudes that help you to participate in your own health care.  Learn new ways to care for yourself.  Be more accepting about how things are, and let things go.  Be less judgmental and approach things with an open mind.  Be patient with yourself and trust yourself more. MBSR has also been shown to:  Reduce negative emotions, such as depression and anxiety.  Improve memory and focus.  Change how you sense and approach pain.  Boost your body's ability to fight infections.  Help you connect better with other people.  Improve your sense of well-being. Follow these instructions at home:  Find a local in-person or online MBSR program.  Set aside some time regularly for mindfulness practice.  Find a mindfulness practice that works best for you. This may include one or more of the following: ? Meditation. Meditation involves focusing your mind on a certain thought or activity. ? Breathing awareness exercises. These help you to stay present by focusing on your breath. ? Body scan. For this practice, you lie down and pay attention to each part of your body from head to toe. You can identify tension and soreness and intentionally relax parts of your body. ? Yoga. Yoga involves stretching and breathing, and it can improve your ability to move and be flexible. It can also provide an experience of testing your body's limits, which can help you release stress. ? Mindful eating. This way of eating involves focusing on the taste, texture, color, and smell of each bite of food. Because this slows down eating and helps you feel full sooner, it can be an important part of a weight-loss plan.  Find a podcast or recording that provides guidance for breathing awareness, body scan, or meditation  exercises. You can listen to these any time when you have a free moment to rest without distractions.  Follow your treatment plan as told by your health care provider. This may include taking regular medicines and making changes to your diet or lifestyle as recommended.   How to practice mindfulness To do a basic awareness exercise:  Find a comfortable place to sit.  Pay attention to the present moment. Observe your thoughts, feelings, and surroundings just as they are.  Avoid placing judgment on yourself, your feelings, or your surroundings. Make note of any judgment that comes up, and let it go.  Your mind may wander, and that is okay. Make note of when your thoughts drift, and return your attention to the present moment. To do basic mindfulness meditation:  Find a comfortable place to sit. This may include a stable chair or a firm floor cushion. ? Sit upright with your back straight. Let your arms fall next to your side with your hands resting on your legs. ? If sitting in a chair, rest your feet flat on the floor. ? If sitting on a cushion, cross your legs in front of you.  Keep your head in a neutral position with your chin dropped slightly. Relax your jaw and rest the tip of your tongue on the roof of your mouth. Drop your gaze to the floor. You can close your eyes if you like.  Breathe normally and pay attention to your breath. Feel the air moving in and out of your nose. Feel your belly expanding and relaxing with each breath.  Your mind may wander, and that is okay. Make note of when your thoughts drift, and return your attention to your breath.  Avoid placing judgment on yourself, your feelings, or your surroundings. Make note of any judgment or feelings that come up, let them go, and bring your attention back to your breath.  When you are ready, lift your gaze or open your eyes. Pay attention to how your body feels after the meditation. Where to find more information You can  find more information about MBSR from:  Your health care provider.  Community-based meditation centers or programs.  Programs offered near you. Summary  Mindfulness-based stress reduction (MBSR) is a program that teaches you how to intentionally pay attention to the present moment. It is used with other treatments to help you cope better with daily stress, emotions, and pain.  MBSR focuses on developing self-awareness, which allows you to respond to life stress without judgment or negative emotions.  MBSR programs may involve learning different mindfulness practices, such as breathing exercises, meditation, yoga, body scan, or mindful eating. Find a mindfulness practice that works best for you, and set aside time for it on a regular basis. This information is not intended to replace advice given to you by your health care provider. Make sure you discuss any questions you have with your health care provider. Document Revised: 01/09/2020 Document Reviewed: 01/09/2020 Elsevier Patient Education  2021 [employer].

## 2020-10-07 NOTE — Telephone Encounter (Signed)
Reviewed neurology note recommended to follow up with [facility] for possible anxiety, consider ENT vestibular evaluation and MRI if anxiety treatment does not improve symptoms.  Attempted to reach patient not in workcenter at this time will follow up again next week.

## 2020-10-14 NOTE — Telephone Encounter (Signed)
Patient wanted to discuss lipid panel results further today along with diet. Reviewed Epic with patient. He does not want to start statin.  PCM gave him anxiety medication Rx but he did not start taking it.  Discussed [facility] has behavior health provider through work benefit.  RN Rolly Salter will forward him information again to set up counseling appt.  Patient reported sleeping 5 hours per night.  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  Discussed if alarm waking him up in morning he is not getting enough sleep at night.  He stated sometimes takes nap on weekend.  Discussed goal for sleep 7 hours per night and waking up without alarm.  Start to go to bed 15 minutes earlier and adjust weekly if still not waking up before alarm.  Consider starting medication PCM gave him for anxiety.  He reported still having pins and needles sensations in limbs.  Monitoring other symptoms to see if he is more stressed or anxious when they occur.  Encouraged patient to ensure he is getting 150 minutes exercise per week.  Eating regular meals also.  Patient verbalized understanding information/instructions, agreed with plan of care and had no further questions at this time.

## 2020-10-28 NOTE — Telephone Encounter (Signed)
RN Rolly Salter completing [facility] Well paperwork with labs completed at specialist office.  Patient to contact clinic if needs further assistance with Mental Health concerns.  He is following up with his PCM and was given Rx but did not start it for anxiety.

## 2020-10-28 NOTE — Telephone Encounter (Signed)
Information emailed.

## 2021-01-07 ENCOUNTER — Ambulatory Visit: Payer: No Typology Code available for payment source | Admitting: Family Medicine

## 2021-11-03 ENCOUNTER — Ambulatory Visit: Payer: No Typology Code available for payment source | Admitting: Family Medicine

## 2021-11-03 ENCOUNTER — Encounter: Payer: Self-pay | Admitting: Family Medicine

## 2021-11-03 VITALS — BP 138/80 | HR 55 | Temp 97.0°F | Ht 72.0 in | Wt 179.2 lb

## 2021-11-03 DIAGNOSIS — Z0000 Encounter for general adult medical examination without abnormal findings: Secondary | ICD-10-CM | POA: Diagnosis not present

## 2021-11-03 DIAGNOSIS — Z23 Encounter for immunization: Secondary | ICD-10-CM | POA: Insufficient documentation

## 2021-11-03 LAB — CBC
HCT: 45.8 % (ref 39.0–52.0)
Hemoglobin: 15.3 g/dL (ref 13.0–17.0)
MCHC: 33.4 g/dL (ref 30.0–36.0)
MCV: 88.8 fl (ref 78.0–100.0)
Platelets: 226 10*3/uL (ref 150.0–400.0)
RBC: 5.16 Mil/uL (ref 4.22–5.81)
RDW: 13 % (ref 11.5–15.5)
WBC: 3.7 10*3/uL — ABNORMAL LOW (ref 4.0–10.5)

## 2021-11-03 LAB — URINALYSIS, ROUTINE W REFLEX MICROSCOPIC
Bilirubin Urine: NEGATIVE
Hgb urine dipstick: NEGATIVE
Ketones, ur: NEGATIVE
Leukocytes,Ua: NEGATIVE
Nitrite: NEGATIVE
RBC / HPF: NONE SEEN (ref 0–?)
Specific Gravity, Urine: 1.015 (ref 1.000–1.030)
Total Protein, Urine: NEGATIVE
Urine Glucose: NEGATIVE
Urobilinogen, UA: 0.2 (ref 0.0–1.0)
WBC, UA: NONE SEEN (ref 0–?)
pH: 6 (ref 5.0–8.0)

## 2021-11-03 LAB — COMPREHENSIVE METABOLIC PANEL
ALT: 16 U/L (ref 0–53)
AST: 21 U/L (ref 0–37)
Albumin: 4.7 g/dL (ref 3.5–5.2)
Alkaline Phosphatase: 69 U/L (ref 39–117)
BUN: 12 mg/dL (ref 6–23)
CO2: 28 mEq/L (ref 19–32)
Calcium: 9.7 mg/dL (ref 8.4–10.5)
Chloride: 103 mEq/L (ref 96–112)
Creatinine, Ser: 1.14 mg/dL (ref 0.40–1.50)
GFR: 74.79 mL/min (ref >60.00)
Glucose, Bld: 95 mg/dL (ref 70–99)
Potassium: 4.1 mEq/L (ref 3.5–5.1)
Sodium: 139 mEq/L (ref 135–145)
Total Bilirubin: 1 mg/dL (ref 0.2–1.2)
Total Protein: 7 g/dL (ref 6.0–8.3)

## 2021-11-03 LAB — LIPID PANEL
Cholesterol: 229 mg/dL — ABNORMAL HIGH (ref 0–200)
HDL: 66 mg/dL (ref >39.00)
LDL Cholesterol: 149 mg/dL — ABNORMAL HIGH (ref 0–99)
NonHDL: 162.72
Total CHOL/HDL Ratio: 3
Triglycerides: 70 mg/dL (ref 0.0–149.0)
VLDL: 14 mg/dL (ref 0.0–40.0)

## 2021-11-03 LAB — PSA: PSA: 0.43 ng/mL (ref 0.10–4.00)

## 2021-11-03 NOTE — Progress Notes (Signed)
Established Patient Office Visit  Subjective   Patient ID: John [name], male    DOB: Sep 23, 1970  Age: 51 y.o. MRN: [id number]  Chief Complaint  Patient presents with   Health Maintenance    Patient needing labs for work, no concerns. Patient fasting.     HPI for yearly physical.  Doing well.  Things are going well at home with his wife.  Daughter with autism just graduated from high school.  He sees the dentist twice yearly.  He exercises daily by walking his dog.  Play soccer on the weekends.  He is not interested in a colonoscopy at this time.    Review of Systems  Constitutional: Negative.   HENT: Negative.    Eyes:  Negative for blurred vision, discharge and redness.  Respiratory: Negative.    Cardiovascular: Negative.   Gastrointestinal:  Negative for abdominal pain, blood in stool, constipation and melena.  Genitourinary: Negative.  Negative for frequency, hematuria and urgency.  Musculoskeletal: Negative.  Negative for myalgias.  Skin:  Negative for rash.  Neurological:  Negative for tingling, loss of consciousness and weakness.  Endo/Heme/Allergies:  Negative for polydipsia.      Objective:     BP 138/80 (BP Location: Left Arm, Patient Position: Sitting, Cuff Size: Normal)   Pulse (!) 55   Temp (!) 97 F (36.1 C) (Temporal)   Ht 6' (1.829 m)   Wt 179 lb 3.2 oz (81.3 kg)   SpO2 98%   BMI 24.30 kg/m  BP Readings from Last 3 Encounters:  11/03/21 138/80  09/10/20 136/82  05/04/20 (!) 157/105      Physical Exam Constitutional:      General: He is not in acute distress.    Appearance: Normal appearance. He is not ill-appearing, toxic-appearing or diaphoretic.  HENT:     Head: Normocephalic and atraumatic.     Right Ear: External ear normal.     Left Ear: External ear normal.     Mouth/Throat:     Mouth: Mucous membranes are moist.     Pharynx: Oropharynx is clear. No oropharyngeal exudate or posterior oropharyngeal erythema.  Eyes:     General: No  scleral icterus.       Right eye: No discharge.        Left eye: No discharge.     Extraocular Movements: Extraocular movements intact.     Conjunctiva/sclera: Conjunctivae normal.     Pupils: Pupils are equal, round, and reactive to light.  Cardiovascular:     Rate and Rhythm: Normal rate and regular rhythm.  Pulmonary:     Effort: Pulmonary effort is normal. No respiratory distress.     Breath sounds: Normal breath sounds.  Abdominal:     General: Bowel sounds are normal. There is no distension.     Palpations: There is no mass.     Tenderness: There is no abdominal tenderness. There is no guarding.  Musculoskeletal:     Cervical back: No rigidity or tenderness.  Skin:    General: Skin is warm and dry.  Neurological:     Mental Status: He is alert and oriented to person, place, and time.  Psychiatric:        Mood and Affect: Mood normal.        Behavior: Behavior normal.      No results found for any visits on 11/03/21.    The 10-year ASCVD risk score (Arnett DK, et al., 2019) is: 3.8%    Assessment & Plan:  Problem List Items Addressed This Visit       Other   Healthcare maintenance - Primary   Relevant Orders   CBC   Comprehensive metabolic panel   Lipid panel   PSA   Urinalysis, Routine w reflex microscopic    Return in about 1 year (around 11/04/2022), or if symptoms worsen or fail to improve.  Encouraged him to continue his healthy and active lifestyle.  Information was given on the Cologuard because he is not interested in having a colonoscopy at this time.  Information was given on health maintenance and disease prevention.  There is elevation in LDL cholesterol have given information about the Mediterranean diet.  He is not interested in HIV or hep C screening at this time.  Believes that he has had a tetanus recently.  Will check and let us know.  Mliss Sax, MD

## 2021-11-23 ENCOUNTER — Encounter: Payer: Self-pay | Admitting: Registered Nurse

## 2021-11-23 ENCOUNTER — Telehealth: Payer: Self-pay | Admitting: Registered Nurse

## 2021-11-23 DIAGNOSIS — E785 Hyperlipidemia, unspecified: Secondary | ICD-10-CM

## 2021-11-23 NOTE — Telephone Encounter (Signed)
Be Well paperwork completed with patient.  Hgba1c not drawn.  Met blood pressure and BMI requirements.  Has set up teledoc account.  HR Alexis notified patient met requirements for insurance discount and given given required paperwork.  Patient attested he does not smoke or vape.  Patient had labs and office visit with PCM 11/03/21 Epic reviewed.  Discussed with patient HDL/LDL ratio improved this year but LDL and total cholesterol still elevated.  He is working on diet and exercise.  Normal spot glucose/kidney/liver function/electrolytes and PSA.  No anemia on CBC. UA normal except some mucous.  Patient stated PCM had discussed all lab results with him but he asked that I review them also.  Patient verbalized understanding information/instructions, agreed with plan of care and had no further questions at this time.  Be Well insurance premium discount evaluation: Labs Drawn at PCM office [facility] ROI form signed. Tobacco Free Attestation form signed.  Forms placed in paper chart located at [facility].   Latest Reference Range & Units 09/10/20 09:17 11/03/21 10:09  COMPREHENSIVE METABOLIC PANEL  Rpt ! Rpt  Sodium 135 - 145 mEq/L 144 139  Potassium 3.5 - 5.1 mEq/L 4.1 4.1  Chloride 96 - 112 mEq/L 104 103  CO2 19 - 32 mEq/L 29 28  Glucose 70 - 99 mg/dL 103 (H) 95  BUN 6 - 23 mg/dL 14 12  Creatinine 0.40 - 1.50 mg/dL 1.16 1.14  Calcium 8.4 - 10.5 mg/dL 10.1 9.7  Alkaline Phosphatase 39 - 117 U/L 78 69  Albumin 3.5 - 5.2 g/dL 4.9 4.7  AST 0 - 37 U/L 16 21  ALT 0 - 53 U/L 17 16  Total Protein 6.0 - 8.3 g/dL 7.5 7.0  Total Bilirubin 0.2 - 1.2 mg/dL 1.0 1.0  GFR >60.00 mL/min 73.83 74.79  Total CHOL/HDL Ratio  4 3  Cholesterol 0 - 200 mg/dL 250 (H) 229 (H)  HDL Cholesterol >39.00 mg/dL 69.40 66.00  LDL (calc) 0 - 99 mg/dL 168 (H) 149 (H)  NonHDL  180.55 162.72  Triglycerides 0.0 - 149.0 mg/dL 63.0 70.0  VLDL 0.0 - 40.0 mg/dL 12.6 14.0  WBC 4.0 - 10.5 K/uL 4.2 3.7 (L)  RBC  4.22 - 5.81 Mil/uL 5.24 5.16  Hemoglobin 13.0 - 17.0 g/dL 15.8 15.3  HCT 39.0 - 52.0 % 46.3 45.8  MCV 78.0 - 100.0 fl 88.3 88.8  MCHC 30.0 - 36.0 g/dL 34.1 33.4  RDW 11.5 - 15.5 % 13.1 13.0  Platelets 150.0 - 400.0 K/uL 264.0 226.0  PSA 0.10 - 4.00 ng/mL 0.42 0.43  URINALYSIS, ROUTINE W REFLEX MICROSCOPIC  Rpt Rpt !  Appearance Clear;Turbid;Slightly Cloudy;Cloudy  CLEAR CLEAR  Bilirubin Urine Negative  NEGATIVE NEGATIVE  Color, Urine Yellow;Lt. Yellow;Straw;Dark Yellow;Amber;Green;Red;Brown  YELLOW YELLOW  Hgb urine dipstick Negative  NEGATIVE NEGATIVE  Ketones, ur Negative  NEGATIVE NEGATIVE  Leukocytes,Ua Negative  NEGATIVE NEGATIVE  Nitrite Negative  NEGATIVE NEGATIVE  pH 5.0 - 8.0  5.5 6.0  Specific Gravity, Urine 1.000 - 1.030  1.015 1.015  Urine Glucose Negative  NEGATIVE NEGATIVE  Urobilinogen, UA 0.0 - 1.0  0.2 0.2  Mucus, UA None   Presence of !  RBC / HPF 0-2/hpf  none seen none seen  WBC, UA 0-2/hpf  0-2/hpf none seen  Total Protein, Urine-UPE24 Negative  NEGATIVE NEGATIVE  !: Data is abnormal (H): Data is abnormally high (L): Data is abnormally low Rpt: View report in Results Review for more information 

## 2022-10-12 ENCOUNTER — Telehealth: Payer: Self-pay | Admitting: Registered Nurse

## 2022-10-12 DIAGNOSIS — R7989 Other specified abnormal findings of blood chemistry: Secondary | ICD-10-CM

## 2022-10-12 DIAGNOSIS — E785 Hyperlipidemia, unspecified: Secondary | ICD-10-CM

## 2022-10-12 DIAGNOSIS — Z0000 Encounter for general adult medical examination without abnormal findings: Secondary | ICD-10-CM

## 2022-10-22 NOTE — Progress Notes (Signed)
Noted patient has had follow up labs 

## 2022-10-26 ENCOUNTER — Other Ambulatory Visit: Payer: No Typology Code available for payment source | Admitting: Occupational Medicine

## 2022-10-26 DIAGNOSIS — Z0000 Encounter for general adult medical examination without abnormal findings: Secondary | ICD-10-CM

## 2022-10-26 NOTE — Progress Notes (Signed)
Lab drawn from Right AC tolerated well no issues noted.   

## 2022-10-26 NOTE — Telephone Encounter (Signed)
Patient asked if PSA could be added grandfather with prostate cancer age 52 had been getting his checked yearly.  Discussed USPTF states age 31 and up or 10 years prior to diagnoses family member.  Due to budget constraints in clinic and this PSA also not following guidelines he would need to discuss with his PCM to have drawn.  Discussed all previous PSA on file in epic normal last done 10/2021 and patient asymptomatic.   Patient stated he will discuss with PCM.  Patient verbalized understanding information.

## 2022-10-27 LAB — CMP12+LP+TP+TSH+6AC+CBC/D/PLT
ALT: 17 IU/L (ref 0–44)
AST: 18 IU/L (ref 0–40)
Albumin: 4.6 g/dL (ref 3.8–4.9)
Alkaline Phosphatase: 76 IU/L (ref 44–121)
BUN/Creatinine Ratio: 9 (ref 9–20)
BUN: 10 mg/dL (ref 6–24)
Basophils Absolute: 0 10*3/uL (ref 0.0–0.2)
Basos: 1 %
Bilirubin Total: 0.7 mg/dL (ref 0.0–1.2)
Calcium: 9.6 mg/dL (ref 8.7–10.2)
Chloride: 101 mmol/L (ref 96–106)
Chol/HDL Ratio: 3.5 ratio (ref 0.0–5.0)
Cholesterol, Total: 240 mg/dL — ABNORMAL HIGH (ref 100–199)
Creatinine, Ser: 1.09 mg/dL (ref 0.76–1.27)
EOS (ABSOLUTE): 0.2 10*3/uL (ref 0.0–0.4)
Eos: 4 %
Estimated CHD Risk: 0.6 times avg. (ref 0.0–1.0)
Free Thyroxine Index: 2.2 (ref 1.2–4.9)
GGT: 10 IU/L (ref 0–65)
Globulin, Total: 2.2 g/dL (ref 1.5–4.5)
Glucose: 92 mg/dL (ref 70–99)
HDL: 68 mg/dL (ref >39)
Hematocrit: 45.6 % (ref 37.5–51.0)
Hemoglobin: 15.2 g/dL (ref 13.0–17.7)
Immature Grans (Abs): 0 10*3/uL (ref 0.0–0.1)
Immature Granulocytes: 1 %
Iron: 165 ug/dL (ref 38–169)
LDH: 162 IU/L (ref 121–224)
LDL Chol Calc (NIH): 159 mg/dL — ABNORMAL HIGH (ref 0–99)
Lymphocytes Absolute: 1.7 10*3/uL (ref 0.7–3.1)
Lymphs: 38 %
MCH: 30 pg (ref 26.6–33.0)
MCHC: 33.3 g/dL (ref 31.5–35.7)
MCV: 90 fL (ref 79–97)
Monocytes Absolute: 0.3 10*3/uL (ref 0.1–0.9)
Monocytes: 6 %
Neutrophils Absolute: 2.2 10*3/uL (ref 1.4–7.0)
Neutrophils: 50 %
Phosphorus: 2.8 mg/dL (ref 2.8–4.1)
Platelets: 264 10*3/uL (ref 150–450)
Potassium: 4 mmol/L (ref 3.5–5.2)
RBC: 5.07 x10E6/uL (ref 4.14–5.80)
RDW: 12.5 % (ref 11.6–15.4)
Sodium: 143 mmol/L (ref 134–144)
T3 Uptake Ratio: 30 % (ref 24–39)
T4, Total: 7.3 ug/dL (ref 4.5–12.0)
TSH: 3.14 u[IU]/mL (ref 0.450–4.500)
Total Protein: 6.8 g/dL (ref 6.0–8.5)
Triglycerides: 76 mg/dL (ref 0–149)
Uric Acid: 5.4 mg/dL (ref 3.8–8.4)
VLDL Cholesterol Cal: 13 mg/dL (ref 5–40)
WBC: 4.4 10*3/uL (ref 3.4–10.8)
eGFR: 82 mL/min/{1.73_m2} (ref >59)

## 2022-10-27 LAB — VITAMIN D 25 HYDROXY (VIT D DEFICIENCY, FRACTURES): Vit D, 25-Hydroxy: 29.1 ng/mL — ABNORMAL LOW (ref 30.0–100.0)

## 2022-10-27 LAB — HEMOGLOBIN A1C
Est. average glucose Bld gHb Est-mCnc: 111 mg/dL
Hgb A1c MFr Bld: 5.5 % (ref 4.8–5.6)

## 2022-10-30 ENCOUNTER — Ambulatory Visit: Payer: No Typology Code available for payment source | Admitting: Occupational Medicine

## 2022-10-30 ENCOUNTER — Encounter: Payer: Self-pay | Admitting: Registered Nurse

## 2022-10-30 VITALS — BP 122/70 | HR 51 | Ht 72.0 in | Wt 177.0 lb

## 2022-10-30 DIAGNOSIS — Z0000 Encounter for general adult medical examination without abnormal findings: Secondary | ICD-10-CM

## 2022-10-30 MED ORDER — VITAMIN D3 50 MCG (2000 UT) PO CAPS: 2000.0000 [IU] | ORAL_CAPSULE | Freq: Every day | ORAL | 3 refills | Status: AC

## 2022-10-30 NOTE — Progress Notes (Signed)
Be well insurance premium discount evaluation: Met  Epic reviewed by RN Kimrey transcribed labs and reviewed with the patient.   Tobacco attestation signed. [facility] ROI formed signed. Forms placed in the chart.   Patient given handouts for [facility] pharmacies and discount drugs list, MyChart, [facility], [facility], [place] counseling and [employer] counseling.  What to do for infectious illness protocol. Given handout for list of medications that can be filled at [facility]. Given Clinic hours and Clinic Email.

## 2022-11-26 NOTE — Progress Notes (Signed)
RN Bess Kinds notified NP patient had been taking Krill oil 500mg  every other day and omega 3 fish oil 1400mg  naturemade capsule (1000mg  omega 3) and patient with question if he should continue use started 6 months ago OTC.  Patient notified can take 1 daily and add [place] nuts/chia or flax seeds to regimen also and repeat labs in 1 year.  Patient sent NP thank you for guidance.  BP 122/70 weight 177lbs LDL 159 Hgba1c5.5  Reviewed paerwork and signed provider portion Be Well 2025 11/14/2022  2024 results 138/80 weight 179 LDL 149.  BP and Hgba1c met 2025 Be Well requirements for insurance discount RN Kimrey notified HR team.  Epic note states patient reviewed results/results note electronically 11/26/22

## 2022-12-11 ENCOUNTER — Telehealth: Payer: Self-pay | Admitting: Registered Nurse

## 2022-12-11 ENCOUNTER — Encounter: Payer: Self-pay | Admitting: Registered Nurse

## 2022-12-11 ENCOUNTER — Other Ambulatory Visit: Payer: Self-pay | Admitting: Registered Nurse

## 2022-12-11 DIAGNOSIS — M25551 Pain in right hip: Secondary | ICD-10-CM

## 2022-12-11 NOTE — Telephone Encounter (Signed)
Patient reported played soccer yesterday felt well after match. Not work related. Woke up 0200-0300 and couldn't fall back to sleep due to right lateral hip pain.  Tried to remember if anything happened during match remembers contact with another player being elbowed around that area lateral hip.  Denied falls/trips/stumbles/slips or feeling muscle pull during ball handling or running.  Limping today and painful to lie on his side in bed.  Denied rash/bruising.  Took ibuprofen 200mg  2 tabs 1 hour ago starting to feel a little better.  Left work early today.  Sitting okay but walking or lying worsen pain.  Scheduled for appt 0900 tomorrow 12/12/22 for evaluation in person.  Discussed may take up to 800mg  ibuprofen every 8 hours as needed for pain.  May use biofreeze or icy hot per manufacturer instructions.  May alternate ibuprofen with tylenol 1000mg  po q6h prn pain.  Patient stated does not have any tylenol at home and ibuprofen is helping pain much improved with AROM while speaking to me on phone per patient 1 hour after taking 400mg  dose.  Consider epsom salt soak bath tonight or cryotherapy 15 minutes QID prn pain/swelling.  Discussed pillow between knees if on side on under knees lying on back/wedge.  Exitcare handout hip pain and contusion to my chart.  Discussed possible strain/contusion.  Patient asked if okay to use jacuzzi tonight stated yes as long as no large bruising or swelling/bleeding.  Patient verbalized understanding information/instructions, agreed with plan of care and had no further questions at this time.

## 2022-12-12 ENCOUNTER — Ambulatory Visit: Payer: PRIVATE HEALTH INSURANCE | Admitting: Registered Nurse

## 2022-12-12 ENCOUNTER — Encounter: Payer: Self-pay | Admitting: Registered Nurse

## 2022-12-12 VITALS — Resp 16

## 2022-12-12 DIAGNOSIS — S76011A Strain of muscle, fascia and tendon of right hip, initial encounter: Secondary | ICD-10-CM

## 2022-12-12 NOTE — Progress Notes (Signed)
Subjective:    Patient ID: John [name], male    DOB: October 31, 1970, 52 y.o.   MRN: [id number]  52y/o married caucasian male established patient played soccer this weekend denied injury but in middle of night woke up with sharp right hip pain after soccer game couldn't fall back to sleep.  Limping, TTP and now back hurting also today.  Denied loss of bowel/bladder control, saddle paresthesias/arm/leg weakness/falls/slips/trips.  Does remember another player elbowing him during game near where he is having pain denied bruising/rash/wound/swelling.  Pain worst with hip flexion (walking/getting out of chair) and side lying in bed.  Slept on his back with pillow under knees last night and took ibuprofen 400mg  yesterday helped a lot.  Ice did not help yesterday going to try Jacuzzi/heat tonight.  Ibuprofen helped a lot didn't take any this am so he could tell if pain worsening or improving for appt.     Review of Systems  Constitutional:  Negative for chills, diaphoresis and fever.  Eyes:  Negative for photophobia and visual disturbance.  Respiratory:  Negative for cough.   Cardiovascular:  Negative for leg swelling.  Gastrointestinal:  Negative for diarrhea, nausea and vomiting.  Genitourinary:  Negative for difficulty urinating and enuresis.  Musculoskeletal:  Positive for arthralgias, back pain, gait problem and myalgias. Negative for joint swelling, neck pain and neck stiffness.  Skin:  Negative for color change, pallor, rash and wound.  Neurological:  Negative for dizziness, tremors, seizures, syncope, facial asymmetry, speech difficulty, weakness, light-headedness, numbness and headaches.  Hematological:  Negative for adenopathy. Does not bruise/bleed easily.  Psychiatric/Behavioral:  Positive for sleep disturbance. Negative for agitation and confusion.       Objective:   Physical Exam Vitals and nursing note reviewed.  Constitutional:      General: He is awake. He is not in acute  distress.    Appearance: Normal appearance. He is well-developed, well-groomed and normal weight. He is not ill-appearing, toxic-appearing or diaphoretic.  HENT:     Head: Normocephalic and atraumatic.     Jaw: There is normal jaw occlusion.     Salivary Glands: Right salivary gland is not diffusely enlarged. Left salivary gland is not diffusely enlarged.     Right Ear: Hearing and external ear normal.     Left Ear: Hearing and external ear normal.     Nose: Nose normal. No congestion or rhinorrhea.     Mouth/Throat:     Lips: Pink. No lesions.     Mouth: Mucous membranes are moist.     Pharynx: Oropharynx is clear.  Eyes:     General: Lids are normal. Vision grossly intact. Gaze aligned appropriately. No scleral icterus.       Right eye: No discharge.        Left eye: No discharge.     Extraocular Movements: Extraocular movements intact.     Conjunctiva/sclera: Conjunctivae normal.     Pupils: Pupils are equal, round, and reactive to light.  Neck:     Trachea: Trachea normal.  Cardiovascular:     Rate and Rhythm: Normal rate and regular rhythm.     Pulses: Normal pulses.  Pulmonary:     Effort: Pulmonary effort is normal. No respiratory distress.     Breath sounds: Normal breath sounds and air entry. No stridor or transmitted upper airway sounds. No wheezing.     Comments: Spoke full sentences without difficulty; no cough observed in exam room Abdominal:     General: Abdomen is flat.  Musculoskeletal:        General: Tenderness present. No swelling or deformity.     Right hand: Normal strength. Normal capillary refill.     Left hand: Normal strength. Normal capillary refill.     Cervical back: Normal range of motion and neck supple. No swelling, edema, deformity, erythema, signs of trauma, lacerations, rigidity, spasms, torticollis, tenderness, bony tenderness or crepitus. No pain with movement. Normal range of motion.     Thoracic back: No swelling, edema, deformity, signs of  trauma, lacerations, spasms, tenderness or bony tenderness. Normal range of motion. No scoliosis.     Lumbar back: No swelling, edema, deformity, signs of trauma, lacerations, spasms, tenderness or bony tenderness. Decreased range of motion. No scoliosis.       Back:     Right hip: Tenderness present. No deformity, lacerations, bony tenderness or crepitus. Decreased range of motion. Normal strength.     Left hip: No deformity, lacerations, tenderness, bony tenderness or crepitus. Normal range of motion. Normal strength.     Right knee: No crepitus. Normal range of motion.     Left knee: No crepitus. Normal range of motion.     Right lower leg: No lacerations. No edema.     Left lower leg: No lacerations. No edema.     Right ankle: No swelling, deformity, ecchymosis or lacerations. Normal range of motion.     Left ankle: No swelling, deformity, ecchymosis or lacerations. Normal range of motion.       Legs:     Comments: Favoring right leg with weight bearing/flexion and crossing right leg in front of or in back of left leg worsening anterior right hip pain at superior iliac crest soft tissue no palpable defect/bruising/abrasion/swelling/crepitus on palpation.  AROM decreased 15 degrees from left flexion/adduction; Exquisitely TTP at flexor insertion site; extension does not worsen pain; midline low back pain with walking/lateral bending and rotation bilaterally  Lymphadenopathy:     Head:     Right side of head: No submandibular or preauricular adenopathy.     Left side of head: No submandibular or preauricular adenopathy.     Cervical: No cervical adenopathy.     Right cervical: No superficial cervical adenopathy.    Left cervical: No superficial cervical adenopathy.  Skin:    General: Skin is warm and dry.     Capillary Refill: Capillary refill takes less than 2 seconds.     Coloration: Skin is not ashen, cyanotic, jaundiced, mottled, pale or sallow.     Findings: No abrasion, bruising,  burn, erythema, signs of injury, laceration, lesion, petechiae, rash or wound.  Neurological:     General: No focal deficit present.     Mental Status: He is alert and oriented to person, place, and time. Mental status is at baseline.     Motor: Motor function is intact. No weakness, tremor, abnormal muscle tone or seizure activity.     Coordination: Coordination is intact. Coordination normal.     Gait: Gait is intact. Gait normal.     Comments: In/out of chair without difficulty; gait sure and steady in clinic; bilateral hand grasp equal 5/5  Psychiatric:        Attention and Perception: Attention and perception normal.        Mood and Affect: Mood and affect normal.        Speech: Speech normal.        Behavior: Behavior normal. Behavior is cooperative.        Thought Content:  Thought content normal.        Cognition and Memory: Cognition and memory normal.        Judgment: Judgment normal.           Assessment & Plan:  A-hip strain right initial encounter  P-Discussed may be strain plus bone contusion from elbow to hop but no sign of bruising/swelling on exam today.  continue ibuprofen 400mg  po q6h prn pain with food OTC.  Trial heat tonight jacuzzi at home.  discussed heat brings more circulation to area to help repair tissues.  Discussed adrenaline/beers after match may have initially masked pain.  Gentle AROM if any of the handout exercises worsening pain skip them and notify me at follow up appt which were skipped.  Continue back lying for sleep tonight with pillow under knees as wedge and avoid lying directly on right hip.  Exitcare handouts hip exercises, acute hip pain printed and given to patient.  Re-evaluation if new or worsening symptoms e.g. foot drop, numbness, pain not controlled with ibuprofen sooner than follow up in 2 weeks if not improving with plan of care.  Discussed with patient may take a couple weeks for pain to fully resolve.  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  Patient verbalized understanding information/instructions, agreed with plan of care and had no further questions at this time.

## 2022-12-12 NOTE — Patient Instructions (Signed)
Hip Exercises Ask your health care provider which exercises are safe for you. Do exercises exactly as told by your provider and adjust them as told. It is normal to feel mild stretching, pulling, tightness, or discomfort as you do these exercises. Stop right away if you feel sudden pain or your pain gets worse. Do not begin these exercises until told by your provider. Stretching and range-of-motion exercises These exercises warm up your muscles and joints and improve the movement and flexibility of your hip. They also help to relieve pain, numbness, and tingling. You may be asked to limit your range of motion if you had a hip replacement. Talk to your provider about these limits. Hamstrings, supine  Lie on your back (supine position). Loop a belt, towel, or exercise band over the ball of your left / right foot. The ball of your foot is on the walking surface, right under your toes. Straighten your left / right knee and slowly pull on the belt, towel, or band to raise your leg until you feel a gentle stretch behind your knee (hamstring). Do not let your knee bend while you do this. Keep your other leg flat on the floor. Hold this position for _____30_____ seconds. Slowly return your leg to the starting position. Repeat ____3______ times. Complete this exercise ____2_____ times a day. Hip rotation  Lie on your back on a firm surface. With your left / right hand, gently pull your left / right knee toward the shoulder that is on the same side of the body. Stop when your knee is pointing toward the ceiling. Hold your left / right ankle with your other hand. Keeping your knee steady, gently pull your left / right ankle toward your other shoulder until you feel a stretch in your butt. Keep your hips and shoulders firmly planted while you do this stretch. Hold this position for ___30_______ seconds. Repeat _____3_____ times. Complete this exercise _____2_____ times a day. Seated stretch This exercise  is sometimes called hamstrings and adductors stretch. Sit on the floor with your legs stretched wide. Keep your knees straight during this exercise. Keeping your head and back in a straight line, bend at your waist to reach for your left foot (position A). You should feel a stretch in your right inner thigh (adductors). Hold this position for _____30_____ seconds. Then slowly return to the upright position. Keeping your head and back in a straight line, bend at your waist to reach forward (position B). You should feel a stretch behind both of your thighs and knees (hamstrings). Hold this position for _____30_____ seconds. Then slowly return to the upright position. Keeping your head and back in a straight line, bend at your waist to reach for your right foot (position C). You should feel a stretch in your left inner thigh (adductors). Hold this position for _____30_____ seconds. Then slowly return to the upright position. Repeat _____3_____ times. Complete this exercise _____2_____ times a day. Lunge This exercise stretches the muscles of the hip (hip flexors). Place your left / right knee on the floor and bend your other knee so that is directly over your ankle. You should be half-kneeling. Keep good posture with your head over your shoulders. Tighten your butt muscles to point your tailbone downward. This will prevent your back from arching too much. You should feel a gentle stretch in the front of your left / right thigh and hip. If you do not feel a stretch, slide your other foot forward slightly and  then slowly lunge forward with your chest up until your knee once again lines up over your ankle. Make sure your tailbone continues to point downward. Hold this position for ___30_______ seconds. Slowly return to the starting position. Repeat ____3______ times. Complete this exercise ____2______ times a day. Strengthening exercises These exercises build strength and endurance in your hip.  Endurance is the ability to use your muscles for a long time, even after they get tired. Bridge This exercise strengthens the muscles of your hip (hip extensors). Lie on your back on a firm surface with your knees bent and your feet flat on the floor. Tighten your butt muscles and lift your bottom off the floor until the trunk of your body and your hips are level with your thighs. Do not arch your back. You should feel the muscles working in your butt and the back of your thighs. If you do not feel these muscles, slide your feet 1-2 inches (2.5-5 cm) farther away from your butt. Hold this position for ___30_______ seconds. Slowly lower your hips to the starting position. Let your muscles relax completely between repetitions. Repeat _____3_____ times. Complete this exercise _____2_____ times a day. Straight leg raises, side-lying This exercise strengthens the muscles that move the hip joint away from the center of the body (hip abductors). Lie on your side with your left / right leg in the top position. Lie so your head, shoulder, hip, and knee line up. You may bend your bottom knee slightly to help you balance. Roll your hips slightly forward, so your hips are stacked directly over each other and your left / right knee is facing forward. Leading with your heel, lift your top leg 4-6 inches (10-15 cm). You should feel the muscles in your top hip lifting. Do not let your foot drift forward. Do not let your knee roll toward the ceiling. Hold this position for ____30______ seconds. Slowly return to the starting position. Let your muscles relax completely between repetitions. Repeat _____3_____ times. Complete this exercise __________ times a day. Straight leg raises, side-lying This exercise strengthens the muscles that move the hip joint toward the center of the body (hip adductors). Lie on your side with your left / right leg in the bottom position. Lie so your head, shoulder, hip, and knee  line up. You may place your upper foot in front to help you balance. Roll your hips slightly forward, so your hips are stacked directly over each other and your left / right knee is facing forward. Tense the muscles in your inner thigh and lift your bottom leg 4-6 inches (10-15 cm). Hold this position for ____30______ seconds. Slowly return to the starting position. Let your muscles relax completely between repetitions. Repeat _____3_____ times. Complete this exercise ____2______ times a day. Straight leg raises, supine This exercise strengthens the muscles in the front of your thigh (quadriceps and hip flexors). Lie on your back (supine position) with your left / right leg extended and your other knee bent. Tense the muscles in the front of your left / right thigh. You should see your kneecap slide up or see increased dimpling just above your knee. Keep these muscles tight as you raise your leg 4-6 inches (10-15 cm) off the floor. Do not let your knee bend. Hold this position for ____30______ seconds. Keep these muscles tense as you lower your leg. Relax the muscles slowly and completely between repetitions. Repeat _____3_____ times. Complete this exercise _____2_____ times a day. Hip abductors, standing This  exercise strengthens the muscles that move the leg and hip joint away from the center of the body (hip abductors). Tie one end of a rubber exercise band or tubing to a secure surface, such as a chair, table, or pole. Loop the other end of the band or tubing around your left / right ankle. Keeping your ankle with the band or tubing directly opposite the secured end, step away until there is tension in the tubing or band. Hold on to a chair, table, or pole as needed for balance. Lift your left / right leg out to your side. While you do this: Keep your back upright. Keep your shoulders over your hips. Keep your toes pointing forward. Make sure to use your hip muscles to slowly lift your  leg. Do not tip your body or forcefully lift your leg. Hold this position for ___30_______ seconds. Slowly return to the starting position. Repeat _____3_____ times. Complete this exercise ____2______ times a day. Squats This exercise strengthens the muscles in the front of your thigh (quadriceps). Stand in front of a table, or stand in a doorframe so your feet and knees are in line with the frame. You may place your hands on the table or frame for balance. Slowly bend your knees and lower your hips like you are going to sit in a chair. Keep your lower legs in a straight up-and-down position. Do not let your hips go lower than your knees. Do not bend your knees lower than told by your provider. If your hip pain increases, do not bend as low. Hold this position for ____30_______ seconds. Slowly push with your legs to return to standing. Do not use your hands to pull yourself to standing. Repeat ____3______ times. Complete this exercise ______2____ times a day. This information is not intended to replace advice given to you by your health care provider. Make sure you discuss any questions you have with your health care provider. Document Revised: 12/27/2021 Document Reviewed: 12/27/2021 Else[employer] Patient Education  2024 [employer] Hip Pain The hip is the joint between the upper legs and the lower pelvis. The bones, cartilage, tendons, and muscles of your hip joint support your body and allow you to move around. Hip pain can range from a minor ache to severe pain in one or both of your hips. The pain may be felt on the inside of the hip joint near the groin, or on the outside near the buttocks and upper thigh. You may also have swelling or stiffness in your hip area. Follow these instructions at home: Managing pain, stiffness, and swelling     If told, put ice on the painful area. Put ice in a plastic bag. Place a towel between your skin and the bag. Leave the ice on for 20 minutes, 2-3  times a day. If told, apply heat to the affected area as often as told by your health care provider. Use the heat source that your provider recommends, such as a moist heat pack or a heating pad. Place a towel between your skin and the heat source. Leave the heat on for 20-30 minutes. If your skin turns bright red, remove the ice or heat right away to prevent skin damage. The risk of damage is higher if you cannot feel pain, heat, or cold. Activity Do exercises as told by your provider. Avoid activities that cause pain. General instructions  Take over-the-counter and prescription medicines only as told by your provider. Keep a journal of your symptoms.  Write down: How often you have hip pain. The location of your pain. What the pain feels like. What makes the pain worse. Sleep with a pillow between your legs on your most comfortable side. Keep all follow-up visits. Your provider will monitor your pain and activity. Contact a health care provider if: You cannot put weight on your leg. Your pain or swelling gets worse after a week. It gets harder to walk. You have a fever. Get help right away if: You fall. You have a sudden increase in pain and swelling in your hip. Your hip is red or swollen or very tender to touch. This information is not intended to replace advice given to you by your health care provider. Make sure you discuss any questions you have with your health care provider. Document Revised: 12/27/2021 Document Reviewed: 12/27/2021 Elsevier Patient Education  2024 [employer].

## 2022-12-13 NOTE — Telephone Encounter (Signed)
Patient seen in clinic 12/12/22 see office note symptoms improved on ibuprofen most likely strain hip right on exam.

## 2023-02-09 ENCOUNTER — Telehealth: Payer: Self-pay | Admitting: Registered Nurse

## 2023-02-09 ENCOUNTER — Encounter: Payer: Self-pay | Admitting: Registered Nurse

## 2023-02-09 NOTE — Telephone Encounter (Signed)
Spouse with close covid contact at work 02/05/23 and 02/06/2023.  Patient started wearing a mask as he sleeps with spouse in same bedroom.  Spouse so far asymptomatic and covid test negative.  Discussed with patient monitor for covid symptoms (runny nose, sore throat, headache, fatigue, n/v/d, fever, chills, body aches, cough and/or loss of taste/smell.  Discussed currently most common symptoms fatigue, sore throat, runny nose, nasal congestion and cough.  Masquerading as allergies for most people this summer/fall.  If spouse tests positive to test himself same day and retest in 48 hours x 2 if initial test negative.  Consider sleeping in guest bedroom away from spouse until she has completed 10 day isolation.  Patient wearing mask for 10 days at work and not eating in employee lunch room.   Patient instructed to stay home and notify clinic staff if positive covid test obtained.  Patient A&Ox3 spoke full sentences without difficulty given 3 disposable surgical masks for use when around others at work from clinic stock as currently wearing cloth mask at work only mask he could find at home today.  Respirations even and unlabored RA skin warm dry and pink no cough/congestion/throat clearing observed.  Gait sure and steady. Patient verbalized understanding information/instructions and had no further questions at this time.

## 2023-02-13 NOTE — Telephone Encounter (Signed)
Patient reported spouse has tested negative x 3 times and both he and she are asymptomatic denied concerns or questions will continue to wear mask through day 10 of monitoring for symptoms  A&Ox3 spoke full sentences without difficulty skin warm dry and pink respirations even and unlabored RA gait sure and steady seen in workcenter

## 2023-02-22 NOTE — Telephone Encounter (Signed)
Feeling well denied concerns A&Ox3 spoke full sentences without difficulty no cough audible skin warm dry and pink gait sure and steady encounter closed.

## 2023-03-26 ENCOUNTER — Ambulatory Visit: Payer: Self-pay

## 2023-03-26 VITALS — BP 163/99 | HR 58

## 2023-03-26 DIAGNOSIS — Z0130 Encounter for examination of blood pressure without abnormal findings: Secondary | ICD-10-CM

## 2023-03-26 NOTE — Progress Notes (Signed)
Pt in clinic today for a BP check. Pt states that he likes to keep a check on his BP at least once a month. Pt states that when he checks it at home, "my blood pressure is lower than what the number is here."

## 2023-10-11 ENCOUNTER — Other Ambulatory Visit: Payer: Self-pay

## 2023-10-11 VITALS — BP 129/89 | Ht 71.0 in | Wt 179.0 lb

## 2023-10-11 DIAGNOSIS — Z0000 Encounter for general adult medical examination without abnormal findings: Secondary | ICD-10-CM

## 2023-10-11 NOTE — Progress Notes (Signed)
 [facility]

## 2023-10-12 ENCOUNTER — Ambulatory Visit: Payer: Self-pay | Admitting: Registered Nurse

## 2023-10-12 DIAGNOSIS — R030 Elevated blood-pressure reading, without diagnosis of hypertension: Secondary | ICD-10-CM

## 2023-10-12 DIAGNOSIS — E785 Hyperlipidemia, unspecified: Secondary | ICD-10-CM

## 2023-10-12 LAB — CMP12+LP+TP+TSH+6AC+CBC/D/PLT
ALT: 16 IU/L (ref 0–44)
AST: 21 IU/L (ref 0–40)
Albumin: 4.7 g/dL (ref 3.8–4.9)
Alkaline Phosphatase: 86 IU/L (ref 44–121)
BUN/Creatinine Ratio: 14 (ref 9–20)
BUN: 15 mg/dL (ref 6–24)
Basophils Absolute: 0 10*3/uL (ref 0.0–0.2)
Basos: 1 %
Bilirubin Total: 0.6 mg/dL (ref 0.0–1.2)
Calcium: 10.1 mg/dL (ref 8.7–10.2)
Chloride: 103 mmol/L (ref 96–106)
Chol/HDL Ratio: 3.9 ratio (ref 0.0–5.0)
Cholesterol, Total: 221 mg/dL — ABNORMAL HIGH (ref 100–199)
Creatinine, Ser: 1.06 mg/dL (ref 0.76–1.27)
EOS (ABSOLUTE): 0.1 10*3/uL (ref 0.0–0.4)
Eos: 3 %
Estimated CHD Risk: 0.7 times avg. (ref 0.0–1.0)
Free Thyroxine Index: 1.7 (ref 1.2–4.9)
GGT: 10 IU/L (ref 0–65)
Globulin, Total: 2.5 g/dL (ref 1.5–4.5)
Glucose: 95 mg/dL (ref 70–99)
HDL: 57 mg/dL (ref >39)
Hematocrit: 46.7 % (ref 37.5–51.0)
Hemoglobin: 15.3 g/dL (ref 13.0–17.7)
Immature Grans (Abs): 0 10*3/uL (ref 0.0–0.1)
Immature Granulocytes: 0 %
Iron: 159 ug/dL (ref 38–169)
LDH: 159 IU/L (ref 121–224)
LDL Chol Calc (NIH): 154 mg/dL — ABNORMAL HIGH (ref 0–99)
Lymphocytes Absolute: 1.5 10*3/uL (ref 0.7–3.1)
Lymphs: 34 %
MCH: 30.6 pg (ref 26.6–33.0)
MCHC: 32.8 g/dL (ref 31.5–35.7)
MCV: 93 fL (ref 79–97)
Monocytes Absolute: 0.3 10*3/uL (ref 0.1–0.9)
Monocytes: 7 %
Neutrophils Absolute: 2.4 10*3/uL (ref 1.4–7.0)
Neutrophils: 54 %
Phosphorus: 2.5 mg/dL — ABNORMAL LOW (ref 2.8–4.1)
Platelets: 293 10*3/uL (ref 150–450)
Potassium: 4.5 mmol/L (ref 3.5–5.2)
RBC: 5 x10E6/uL (ref 4.14–5.80)
RDW: 12.7 % (ref 11.6–15.4)
Sodium: 141 mmol/L (ref 134–144)
T3 Uptake Ratio: 28 % (ref 24–39)
T4, Total: 6 ug/dL (ref 4.5–12.0)
TSH: 2.54 u[IU]/mL (ref 0.450–4.500)
Total Protein: 7.2 g/dL (ref 6.0–8.5)
Triglycerides: 59 mg/dL (ref 0–149)
Uric Acid: 5.3 mg/dL (ref 3.8–8.4)
VLDL Cholesterol Cal: 10 mg/dL (ref 5–40)
WBC: 4.4 10*3/uL (ref 3.4–10.8)
eGFR: 84 mL/min/{1.73_m2} (ref >59)

## 2023-10-12 LAB — HEMOGLOBIN A1C
Est. average glucose Bld gHb Est-mCnc: 108 mg/dL
Hgb A1c MFr Bld: 5.4 % (ref 4.8–5.6)

## 2023-10-15 ENCOUNTER — Ambulatory Visit: Payer: Self-pay

## 2023-10-15 VITALS — BP 124/84

## 2023-10-15 DIAGNOSIS — Z0130 Encounter for examination of blood pressure without abnormal findings: Secondary | ICD-10-CM

## 2023-10-15 NOTE — Progress Notes (Unsigned)
 BP recheck and error on New patient.

## 2024-01-22 ENCOUNTER — Ambulatory Visit

## 2024-01-22 NOTE — Progress Notes (Signed)
 Pt presents to clinic for blood pressure check.  He reports that he has been checking at home and randomly gets checked here in clinic.  Runs higher at work and in clinic than at home.  No symptoms, just wants to keep check on blood pressure
